# Patient Record
Sex: Female | Born: 1965
Health system: Southern US, Community
[De-identification: ages and names within clinical notes are randomized; demographics above are authoritative.]

## PROBLEM LIST (undated history)

## (undated) DIAGNOSIS — T7840XA Allergy, unspecified, initial encounter: Secondary | ICD-10-CM

## (undated) DIAGNOSIS — E079 Disorder of thyroid, unspecified: Secondary | ICD-10-CM

## (undated) HISTORY — PX: ABDOMINAL HYSTERECTOMY: SHX81

## (undated) HISTORY — DX: Disorder of thyroid, unspecified: E07.9

## (undated) HISTORY — PX: GASTRIC BYPASS: SHX52

## (undated) HISTORY — PX: CHOLECYSTECTOMY: SHX55

## (undated) HISTORY — DX: Allergy, unspecified, initial encounter: T78.40XA

---

## 2014-01-21 ENCOUNTER — Ambulatory Visit (INDEPENDENT_AMBULATORY_CARE_PROVIDER_SITE_OTHER): Payer: Managed Care, Other (non HMO)

## 2014-01-21 VITALS — BP 113/76 | HR 66 | Resp 18

## 2014-01-21 DIAGNOSIS — M79609 Pain in unspecified limb: Secondary | ICD-10-CM

## 2014-01-21 DIAGNOSIS — Q828 Other specified congenital malformations of skin: Secondary | ICD-10-CM

## 2014-01-21 DIAGNOSIS — B070 Plantar wart: Secondary | ICD-10-CM

## 2014-01-21 NOTE — Progress Notes (Signed)
   Subjective:    Patient ID: Stacey Mcdaniel, female    DOB: 1966-02-18, 48 y.o.   MRN: [id number]  HPI I have these places on the bottom of both my feet and they hurt and have been there for about 2 months and I have tried to take care of it myself    Review of Systems  Constitutional: Positive for unexpected weight change.  Musculoskeletal: Positive for back pain.       Joint pain  All other systems reviewed and are negative.      Objective:   Physical Exam Neurovascular status is intact with pedal pulses palpable DP and PT +2/4 bilateral capillary refill time 3 seconds all digits epicritic and proprioceptive sensations intact and symmetric bilateral normal plantar response DTRs not elicited dermatologically skin color pigment normal hair growth absent NP keratotic lesions sub-fifth metatarsal bilateral sub-fifth met base left multiple lesions of the site are noted patient had history of these with previous treatment has been salicylic acid crumbly for couple days and time with temporary improvement. 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 Patient is also instructed topical salicylic acid application and occlusion with duct tape as instructed applied to acid and duct tape daily for the next 7 days up to 14 days if needed reappointed future and as-needed basis if fails to resolve or recurs in the future as needed. Contact us in changes or exacerbations occur next  Harriet Masson DPM

## 2014-01-21 NOTE — Patient Instructions (Signed)

## 2015-02-10 ENCOUNTER — Emergency Department (HOSPITAL_BASED_OUTPATIENT_CLINIC_OR_DEPARTMENT_OTHER): Payer: Managed Care, Other (non HMO)

## 2015-02-10 ENCOUNTER — Emergency Department (HOSPITAL_BASED_OUTPATIENT_CLINIC_OR_DEPARTMENT_OTHER)
Admission: EM | Admit: 2015-02-10 | Discharge: 2015-02-10 | Disposition: A | Discharge status: Home / Self Care | Payer: Managed Care, Other (non HMO) | Attending: Emergency Medicine | Admitting: Emergency Medicine

## 2015-02-10 ENCOUNTER — Encounter (HOSPITAL_BASED_OUTPATIENT_CLINIC_OR_DEPARTMENT_OTHER): Payer: Self-pay

## 2015-02-10 DIAGNOSIS — W1849XA Other slipping, tripping and stumbling without falling, initial encounter: Secondary | ICD-10-CM | POA: Diagnosis not present

## 2015-02-10 DIAGNOSIS — Y9389 Activity, other specified: Secondary | ICD-10-CM | POA: Diagnosis not present

## 2015-02-10 DIAGNOSIS — Y9289 Other specified places as the place of occurrence of the external cause: Secondary | ICD-10-CM | POA: Insufficient documentation

## 2015-02-10 DIAGNOSIS — Y998 Other external cause status: Secondary | ICD-10-CM | POA: Insufficient documentation

## 2015-02-10 DIAGNOSIS — Z79899 Other long term (current) drug therapy: Secondary | ICD-10-CM | POA: Diagnosis not present

## 2015-02-10 DIAGNOSIS — S52502A Unspecified fracture of the lower end of left radius, initial encounter for closed fracture: Secondary | ICD-10-CM | POA: Insufficient documentation

## 2015-02-10 DIAGNOSIS — S6992XA Unspecified injury of left wrist, hand and finger(s), initial encounter: Secondary | ICD-10-CM | POA: Diagnosis present

## 2015-02-10 DIAGNOSIS — E079 Disorder of thyroid, unspecified: Secondary | ICD-10-CM | POA: Insufficient documentation

## 2015-02-10 DIAGNOSIS — S52602A Unspecified fracture of lower end of left ulna, initial encounter for closed fracture: Secondary | ICD-10-CM | POA: Insufficient documentation

## 2015-02-10 DIAGNOSIS — S6991XA Unspecified injury of right wrist, hand and finger(s), initial encounter: Secondary | ICD-10-CM | POA: Diagnosis not present

## 2015-02-10 MED ORDER — OXYCODONE-ACETAMINOPHEN 5-325 MG PO TABS: 1.0000 | ORAL_TABLET | Freq: Four times a day (QID) | ORAL | Status: DC | PRN

## 2015-02-10 MED ORDER — OXYCODONE-ACETAMINOPHEN 5-325 MG PO TABS
2.0000 | ORAL_TABLET | Freq: Once | ORAL | Status: AC
  Administered 2015-02-10: 2 via ORAL
  Filled 2015-02-10: qty 2

## 2015-02-10 NOTE — ED Notes (Signed)
Pt given Rx x 1 for percocet- Pt has a ride at bedside

## 2015-02-10 NOTE — ED Provider Notes (Signed)
CSN: [id number]     Arrival date & time 02/10/15  1745 History   First MD Initiated Contact with Patient 02/10/15 1807     Chief Complaint  Patient presents with  . Wrist Injury     (Consider location/radiation/quality/duration/timing/severity/associated sxs/prior Treatment) HPI Comments: 49 year old female complaining of bilateral wrist pain occurring around 4:45 PM today. States she was on her lunch break when she slipped, putting her arms behind her and landing onto both of her wrists, left worse than right. States her left wrist pain is "very bad", worse with movements and is starting to swell. No alleviating factors. No medications taken prior to arrival. Her right wrist is only mildly sore, however would like an x-ray of it to ensure there is no break. Denies numbness or tingling.  Patient is a 49 y.o. female presenting with wrist injury. The history is provided by the patient.  Wrist Injury Associated symptoms: no fever     Past Medical History  Diagnosis Date  . Thyroid disease   . Allergy    Past Surgical History  Procedure Laterality Date  . Gastric bypass    . Cholecystectomy    . Abdominal hysterectomy     No family history on file. History  Substance Use Topics  . Smoking status: Never Smoker   . Smokeless tobacco: Never Used  . Alcohol Use: No   OB History    No data available     Review of Systems  Constitutional: Negative for fever.  Respiratory: Negative.   Cardiovascular: Negative.   Musculoskeletal:       + BL wrist pain. L wrist swelling.  Skin: Negative for wound.      Allergies  Review of patient's allergies indicates no known allergies.  Home Medications   Prior to Admission medications   Medication Sig Start Date End Date Taking? Authorizing Provider  levothyroxine (SYNTHROID, LEVOTHROID) 125 MCG tablet Take 125 mcg by mouth daily before breakfast.    Historical Provider, MD  oxyCODONE-acetaminophen (PERCOCET) 5-325 MG per tablet Take  1-2 tablets by mouth every 6 (six) hours as needed for severe pain. 02/10/15   Zillah Alexie M Darrion Macaulay, PA-C   BP 143/87 mmHg  Pulse 78  Temp(Src) 97.8 F (36.6 C) (Oral)  Resp 18  Ht  (1.651 m)  Wt 165 lb (74.844 kg)  BMI 27.46 kg/m2  SpO2 100% Physical Exam  Constitutional: She is oriented to person, place, and time. She appears well-developed and well-nourished. No distress.  HENT:  Head: Normocephalic and atraumatic.  Mouth/Throat: Oropharynx is clear and moist.  Eyes: Conjunctivae and EOM are normal.  Neck: Normal range of motion. Neck supple.  Cardiovascular: Normal rate, regular rhythm and normal heart sounds.   Pulmonary/Chest: Effort normal and breath sounds normal. No respiratory distress.  Musculoskeletal:  R wrist TTP over distal radius. No swelling or deformity. FROM. +2 radial pulse. No snuffbox tenderness. L wrist TTP over distal radius and carpal bones. No snuffbox tenderness. Mild swelling over distal radius. No deformity. ROM limited by pain. +2 radial pulse.  Neurological: She is alert and oriented to person, place, and time. No sensory deficit.  Skin: Skin is warm and dry.  Psychiatric: She has a normal mood and affect. Her behavior is normal.  Nursing note and vitals reviewed.   ED Course  Procedures (including critical care time) Labs Review Labs Reviewed - No data to display  Imaging Review Dg Wrist Complete Left  02/10/2015   CLINICAL DATA:  Bilateral wrist pain  after falling. Initial encounter.  EXAM: LEFT WRIST - COMPLETE 3+ VIEW  COMPARISON:  None.  FINDINGS: The bones are demineralized. There is an acute mildly displaced fracture of the distal radial metaphysis. This fracture demonstrates no definite intra-articular extension. There is mild disruption of the dorsal cortex on the lateral view. There is a mildly displaced ulnar styloid fracture. No evidence of carpal bone fracture or dislocation. Mild soft tissue swelling noted at the wrist.  IMPRESSION: Mildly  displaced fractures of the distal left radius and ulna as described.   Electronically Signed   By: Carey BullocksWilliam  Veazey M.D.   On: 02/10/2015 18:49   Dg Wrist Complete Right  02/10/2015   CLINICAL DATA:  Bilateral wrist pain after falling. Initial encounter.  EXAM: RIGHT WRIST - COMPLETE 3+ VIEW  COMPARISON:  None.  FINDINGS: The bones are mildly demineralized. There is no evidence of acute fracture or dislocation. The joint spaces are maintained. No focal soft tissue swelling identified.  IMPRESSION: No acute osseous findings.   Electronically Signed   By: Carey BullocksWilliam  Veazey M.D.   On: 02/10/2015 18:48     EKG Interpretation None      MDM   Final diagnoses:  Distal radius fracture, left, closed, initial encounter  Distal end of ulna fracture, closed, left, initial encounter   Neurovascularly intact. X-ray showing a mildly displaced fracture of the distal left radius and ulna. I also personally reviewed xrays. Sugar tong splint applied. [place] with. Percocet for pain. Stable for d/c. Return precautions given. Patient states understanding of treatment care plan and is agreeable.  Kathrynn SpeedRobyn M Jaiyah Beining, PA-C 02/10/15 1904  Zadie Rhineonald Wickline, MD 02/11/15 2240

## 2015-02-10 NOTE — ED Notes (Signed)
John, EMT at bedside to splint

## 2015-02-10 NOTE — ED Notes (Addendum)
Fell-pain to left wrist-later stated pain to both wrists and requested xray of both

## 2015-02-10 NOTE — Discharge Instructions (Signed)
Take percocet for severe pain only. No driving or operating heavy machinery while taking percocet. This medication may cause drowsiness. Follow-up with your orthopedist within the next 2-3 days. Keep your arm elevated and apply ice.  Radial Fracture You have a broken bone (fracture) of the forearm. This is the part of your arm between the elbow and your wrist. Your forearm is made up of two bones. These are the radius and ulna. Your fracture is in the radial shaft. This is the bone in your forearm located on the thumb side. A cast or splint is used to protect and keep your injured bone from moving. The cast or splint will be on generally for about 5 to 6 weeks, with individual variations. HOME CARE INSTRUCTIONS   Keep the injured part elevated while sitting or lying down. Keep the injury above the level of your heart (the center of the chest). This will decrease swelling and pain.  Apply ice to the injury for 15-20 minutes, 03-04 times per day while awake, for 2 days. Put the ice in a plastic bag and place a towel between the bag of ice and your cast or splint.  Move your fingers to avoid stiffness and minimize swelling.  If you have a plaster or fiberglass cast:  Do not try to scratch the skin under the cast using sharp or pointed objects.  Check the skin around the cast every day. You may put lotion on any red or sore areas.  Keep your cast dry and clean.  If you have a plaster splint:  Wear the splint as directed.  You may loosen the elastic around the splint if your fingers become numb, tingle, or turn cold or blue.  Do not put pressure on any part of your cast or splint. It may break. Rest your cast only on a pillow for the first 24 hours until it is fully hardened.  Your cast or splint can be protected during bathing with a plastic bag. Do not lower the cast or splint into water.  Only take over-the-counter or prescription medicines for pain, discomfort, or fever as directed by  your caregiver. SEEK IMMEDIATE MEDICAL CARE IF:   Your cast gets damaged or breaks.  You have more severe pain or swelling than you did before getting the cast.  You have severe pain when stretching your fingers.  There is a bad smell, new stains and/or pus-like (purulent) drainage coming from under the cast.  Your fingers or hand turn pale or blue and become cold or your loose feeling. Document Released: 02/15/2006 Document Revised: 11/27/2011 Document Reviewed: 05/14/2006 [facility] Patient Information 2015 [place], [place]. This information is not intended to replace advice given to you by your health care provider. Make sure you discuss any questions you have with your health care provider.  Ulnar Fracture You have a fracture (broken bone) of the forearm. This is the part of your arm between the elbow and your wrist. Your forearm is made up of two bones. These are the radius and ulna. Your fracture is in the ulna. This is the bone in your forearm located on the little finger side of your forearm. A cast or splint is used to protect and keep your injured bone from moving. The cast or splint will be on generally for about 5 to 6 weeks, with individual variations. HOME CARE INSTRUCTIONS   Keep the injured part elevated while sitting or lying down. Keep the injury above the level of your heart (the  center of the chest). This will decrease swelling and pain.  Apply ice to the injury for 15-20 minutes, 03-04 times per day while awake, for 2 days. Put the ice in a plastic bag and place a towel between the bag of ice and your cast or splint.  Move your fingers to avoid stiffness and minimize swelling.  If you have a plaster or fiberglass cast:  Do not try to scratch the skin under the cast using sharp or pointed objects.  Check the skin around the cast every day. You may put lotion on any red or sore areas.  Keep your cast dry and clean.  If you have a plaster splint:  Wear the splint as  directed.  You may loosen the elastic around the splint if your fingers become numb, tingle, or turn cold or blue.  Do not put pressure on any part of your cast or splint. It may break. Rest your cast only on a pillow the first 24 hours until it is fully hardened.  Your cast or splint can be protected during bathing with a plastic bag. Do not lower the cast or splint into water.  Only take over-the-counter or prescription medicines for pain, discomfort, or fever as directed by your caregiver. SEEK IMMEDIATE MEDICAL CARE IF:   Your cast gets damaged or breaks.  You have more severe pain or swelling than you did before the cast.  You have severe pain when stretching your fingers.  There is a bad smell or new stains and/or purulent (pus like) drainage coming from under the cast. Document Released: 02/15/2006 Document Revised: 11/27/2011 Document Reviewed: 07/20/2007 [employer] Patient Information 2015 [place]. This information is not intended to replace advice given to you by your health care provider. Make sure you discuss any questions you have with your health care provider.

## 2015-08-05 ENCOUNTER — Other Ambulatory Visit: Payer: Self-pay | Admitting: Orthopedic Surgery

## 2015-08-05 DIAGNOSIS — M7918 Myalgia, other site: Secondary | ICD-10-CM

## 2015-08-25 ENCOUNTER — Other Ambulatory Visit: Payer: Managed Care, Other (non HMO)

## 2015-08-31 ENCOUNTER — Ambulatory Visit
Admission: RE | Admit: 2015-08-31 | Discharge: 2015-08-31 | Disposition: A | Discharge status: Home / Self Care | Payer: Managed Care, Other (non HMO) | Source: Ambulatory Visit | Attending: Orthopedic Surgery | Admitting: Orthopedic Surgery

## 2015-08-31 DIAGNOSIS — M7918 Myalgia, other site: Secondary | ICD-10-CM

## 2015-08-31 MED ORDER — GADOBENATE DIMEGLUMINE 529 MG/ML IV SOLN
15.0000 mL | Freq: Once | INTRAVENOUS | Status: AC | PRN
  Administered 2015-08-31: 15 mL via INTRAVENOUS

## 2016-03-22 ENCOUNTER — Ambulatory Visit: Payer: Self-pay

## 2016-03-22 ENCOUNTER — Ambulatory Visit (INDEPENDENT_AMBULATORY_CARE_PROVIDER_SITE_OTHER): Payer: Managed Care, Other (non HMO) | Admitting: Podiatry

## 2016-03-22 ENCOUNTER — Encounter: Payer: Self-pay | Admitting: Podiatry

## 2016-03-22 VITALS — BP 131/84 | HR 80 | Resp 16

## 2016-03-22 DIAGNOSIS — M779 Enthesopathy, unspecified: Secondary | ICD-10-CM

## 2016-03-22 DIAGNOSIS — B078 Other viral warts: Secondary | ICD-10-CM

## 2016-03-22 DIAGNOSIS — B070 Plantar wart: Secondary | ICD-10-CM | POA: Diagnosis not present

## 2016-03-22 DIAGNOSIS — B079 Viral wart, unspecified: Secondary | ICD-10-CM

## 2016-03-22 MED ORDER — TRIAMCINOLONE ACETONIDE 10 MG/ML IJ SUSP
10.0000 mg | Freq: Once | INTRAMUSCULAR | Status: AC
  Administered 2016-03-22: 10 mg

## 2016-03-22 NOTE — Progress Notes (Signed)
Subjective:     Patient ID: Stacey Mcdaniel, female   DOB: 1966/01/23, 50 y.o.   MRN: [id number]  HPI patient presents with lesions on the plantar aspect of both feet right over left with keratotic-like tissue formation and fluid buildup around the fifth MPJ right. It is painful when pressed and makes shoe gear difficult and there is fluid and inflammatory changes fifth MPJ right.   Review of Systems     Objective:   Physical Exam Neurovascular status intact muscle strength adequate range of motion within normal limits with inflammation around fifth MPJ right and keratotic lesion with lesions also noted on the plantar aspect of both feet that upon debridement show pinpoint bleeding    Assessment:     Inflammatory capsulitis fifth MPJ right along with probability for verruca plantaris    Plan:     Debrided all lesions and applied chemical agent to create an immune response right and debrided remaining lesions and instructed on what to do if any immune system reaction should occur

## 2016-07-02 ENCOUNTER — Encounter (HOSPITAL_COMMUNITY): Payer: Self-pay

## 2016-07-02 ENCOUNTER — Emergency Department (HOSPITAL_COMMUNITY): Payer: Worker's Compensation

## 2016-07-02 ENCOUNTER — Emergency Department (HOSPITAL_COMMUNITY)
Admission: EM | Admit: 2016-07-02 | Discharge: 2016-07-02 | Disposition: A | Discharge status: Home / Self Care | Payer: Worker's Compensation | Attending: Emergency Medicine | Admitting: Emergency Medicine

## 2016-07-02 DIAGNOSIS — Y9289 Other specified places as the place of occurrence of the external cause: Secondary | ICD-10-CM | POA: Diagnosis not present

## 2016-07-02 DIAGNOSIS — Y990 Civilian activity done for income or pay: Secondary | ICD-10-CM | POA: Insufficient documentation

## 2016-07-02 DIAGNOSIS — M25562 Pain in left knee: Secondary | ICD-10-CM | POA: Insufficient documentation

## 2016-07-02 DIAGNOSIS — W010XXA Fall on same level from slipping, tripping and stumbling without subsequent striking against object, initial encounter: Secondary | ICD-10-CM | POA: Insufficient documentation

## 2016-07-02 DIAGNOSIS — Y939 Activity, unspecified: Secondary | ICD-10-CM | POA: Insufficient documentation

## 2016-07-02 MED ORDER — NAPROXEN 250 MG PO TABS: 250.0000 mg | ORAL_TABLET | Freq: Two times a day (BID) | ORAL | 0 refills | Status: DC

## 2016-07-02 NOTE — ED Triage Notes (Signed)
Pt slipped and fell and hurt her left knee at work on 9/30. Pt reports pain is still present and also has swelling.

## 2016-07-02 NOTE — ED Notes (Signed)
Declined W/C at D/C and was escorted to lobby by RN. 

## 2016-07-02 NOTE — ED Provider Notes (Signed)
[facility]-EMERGENCY DEPT Provider Note   CSN: [id number] Arrival date & time: 07/02/16  1253     History   Chief Complaint Chief Complaint  Patient presents with  . Fall   The history is provided by the patient and medical records. No language interpreter was used.    HPI Comments:  Stacey Mcdaniel is a 50 y.o. female who presents to the Emergency Department complaining of slipping and falling about two weeks ago while she was at work. She states she twisted the left knee when she fell and has been having pain since. Pt states she has arthritis in the left knee and has injured the medial left knee in the past. She has been experiencing a burning pain and swelling since the fall. She is ambulatory without assistance. Standing or walking increases the symptoms. Resting helps to alleviate the pain. She is here for a work note because she would like to return to work. She denies numbness, tingling or weakness of the LLE, bruising, wounds, calf swelling, head injury or LOC at the time of the fall.   Past Medical History:  Diagnosis Date  . Allergy   . Thyroid disease     There are no active problems to display for this patient.   Past Surgical History:  Procedure Laterality Date  . ABDOMINAL HYSTERECTOMY    . CHOLECYSTECTOMY    . GASTRIC BYPASS      OB History    No data available       Home Medications    Prior to Admission medications   Medication Sig Start Date End Date Taking? Authorizing Provider  levothyroxine (SYNTHROID, LEVOTHROID) 125 MCG tablet Take 125 mcg by mouth daily before breakfast.    Historical Provider, MD  naproxen (NAPROSYN) 250 MG tablet Take 1 tablet (250 mg total) by mouth 2 (two) times daily with a meal. 07/02/16   Everlene FarrierWilliam Felisha Claytor, PA-C    Family History History reviewed. No pertinent family history.  Social History Social History  Substance Use Topics  . Smoking status: Never Smoker  . Smokeless tobacco: Never Used  . Alcohol use No      Allergies   Review of patient's allergies indicates no known allergies.   Review of Systems Review of Systems  Constitutional: Negative for fever.  Cardiovascular: Negative for leg swelling.  Musculoskeletal: Positive for arthralgias, joint swelling and myalgias.  Skin: Negative for color change and wound.  Neurological: Negative for syncope, weakness and numbness.     Physical Exam Updated Vital Signs BP 136/94 (BP Location: Right Arm)   Pulse 79   Temp 98 F (36.7 C) (Oral)   Resp 16   Ht 5\' 5"  (1.651 m)   Wt 170 lb (77.1 kg)   SpO2 96%   BMI 28.29 kg/m   Physical Exam  Constitutional: She appears well-developed and well-nourished. No distress.  HENT:  Head: Normocephalic and atraumatic.  Eyes: Right eye exhibits no discharge. Left eye exhibits no discharge.  Cardiovascular: Normal rate, regular rhythm and intact distal pulses.   Bilateral DP and PT pulses intact.  Pulmonary/Chest: Effort normal. No respiratory distress.  Musculoskeletal: Normal range of motion. She exhibits tenderness. She exhibits no edema or deformity.  Mild TTP to left medial knee. No left knee edema, erythema, ecchymosis or warmth. No left knee laxity. No calf edema or tenderness.   Neurological: She is alert. Coordination normal.  Sensation intact to BLE. Normal gait.  Skin: Skin is warm and dry. Capillary refill takes less  than 2 seconds. No rash noted. She is not diaphoretic. No erythema. No pallor.  Psychiatric: She has a normal mood and affect. Her behavior is normal.  Nursing note and vitals reviewed.    ED Treatments / Results  DIAGNOSTIC STUDIES: Oxygen Saturation is 96% on RA, adequate by my interpretation.   COORDINATION OF CARE: 4:40 PM- Will prescribe Naproxen for pain and advised pt to take OTC Tylenol if needed as well. Will give left knee sleeve and note to return to work immediately per pt request. Pt verbalizes understanding and agrees to plan.  Medications - No data  to display   Labs (all labs ordered are listed, but only abnormal results are displayed) Labs Reviewed - No data to display  EKG  EKG Interpretation None       Radiology Dg Knee Complete 4 Views Left  Result Date: 07/02/2016 CLINICAL DATA:  Slipped 2 weeks ago. Medial patellar knee pain since. Fall. EXAM: LEFT KNEE - COMPLETE 4+ VIEW COMPARISON:  None. FINDINGS: Mild degenerative changes are most evident within the lateral and patellofemoral compartments of the knee. There is no effusion. No acute bone or soft tissue abnormality is present. IMPRESSION: 1. Mild degenerative changes within the lateral and patellofemoral compartments of the knee without acute abnormality. Electronically Signed   By: Marin Roberts M.D.   On: 07/02/2016 14:28    Procedures Procedures (including critical care time)  Medications Ordered in ED Medications - No data to display   Initial Impression / Assessment and Plan / ED Course  I have reviewed the triage vital signs and the nursing notes.  Pertinent labs & imaging results that were available during my care of the patient were reviewed by me and considered in my medical decision making (see chart for details).  Clinical Course   This is a 50 y.o. female who presents to the Emergency Department complaining of slipping and falling about two weeks ago while she was at work. She states she twisted the left knee when she fell and has been having pain since. Pt states she has arthritis in the left knee and has injured the medial left knee in the past. She has been experiencing a burning pain and swelling since the fall. She is ambulatory without assistance. Standing or walking increases the symptoms. Resting helps to alleviate the pain. She is here for a work note because she would like to return to work. On examination is afebrile nontoxic appearing. There is mild tenderness to the medial aspect of her left knee. No knee ecchymosis, erythema, edema or  warmth. No left knee laxity noted. X-ray shows some degenerative changes but no acute abnormality. We'll place in a knee sleeve and have her follow-up with sports medicine clinic. I discussed return precautions. I advised the patient to follow-up with their primary care provider this week. I advised the patient to return to the emergency department with new or worsening symptoms or new concerns. The patient verbalized understanding and agreement with plan.    Final Clinical Impressions(s) / ED Diagnoses   Final diagnoses:  Acute pain of left knee    New Prescriptions Discharge Medication List as of 07/02/2016  4:38 PM    START taking these medications   Details  naproxen (NAPROSYN) 250 MG tablet Take 1 tablet (250 mg total) by mouth 2 (two) times daily with a meal., [place] 07/02/2016, Print       I personally performed the services described in this documentation, which was scribed in  my presence. The recorded information has been reviewed and is accurate.        Everlene Farrier, PA-C 07/02/16 1738    Charlynne Pander, MD 07/03/16 [place]

## 2016-12-08 ENCOUNTER — Encounter: Payer: Self-pay | Admitting: Sports Medicine

## 2016-12-08 ENCOUNTER — Ambulatory Visit (INDEPENDENT_AMBULATORY_CARE_PROVIDER_SITE_OTHER): Payer: Managed Care, Other (non HMO) | Admitting: Sports Medicine

## 2016-12-08 DIAGNOSIS — Q828 Other specified congenital malformations of skin: Secondary | ICD-10-CM

## 2016-12-08 DIAGNOSIS — M79672 Pain in left foot: Secondary | ICD-10-CM

## 2016-12-08 DIAGNOSIS — M79671 Pain in right foot: Secondary | ICD-10-CM

## 2016-12-08 NOTE — Progress Notes (Signed)
  Subjective: Stacey Mcdaniel is a 51 y.o. female patient who presents to office for evaluation of Right> Left foot pain secondary to callus skin. Patient complains of pain at the lesion present Right>Left foot at the plantar aspect on both feet under 5th metatarsals. Patient has tried injection and wart treatment last visit with Dr. Paulla Dolly last year with no relief in symptoms. Patient denies any other pedal complaints.   There are no active problems to display for this patient.   Current Outpatient Prescriptions on File Prior to Visit  Medication Sig Dispense Refill  . naproxen (NAPROSYN) 250 MG tablet Take 1 tablet (250 mg total) by mouth 2 (two) times daily with a meal. 30 tablet 0   No current facility-administered medications on file prior to visit.     No Known Allergies  Objective:  General: Alert and oriented x3 in no acute distress  Dermatology: Keratotic lesion present sub met 5 bilateral with skin lines transversing the lesion, pain is present with direct pressure to the lesion with a central nucleated core noted, no webspace macerations, no ecchymosis bilateral, all nails x 10 are well manicured.  Vascular: Dorsalis Pedis and Posterior Tibial pedal pulses 2/4, Capillary Fill Time 3 seconds, + pedal hair growth bilateral, no edema bilateral lower extremities, Temperature gradient within normal limits.  Neurology: Johney Maine sensation intact via light touch bilateral.  Musculoskeletal: Mild tenderness with palpation at the keratotic lesion site on Right>Left, Muscular strength 5/5 in all groups without pain or limitation on range of motion. No lower extremity muscular or boney deformity noted.  Assessment and Plan: Problem List Items Addressed This Visit    None    Visit Diagnoses    Porokeratosis    -  Primary   Foot pain, bilateral         -Complete examination performed -Discussed treatment options -Parred keratoic lesions using a chisel blade; treated the area  withSalinocaine covered with moleskin -Applied metatarsal pad strapping bilateral and advised patient if this works well will benefit long term from orthotics -Encouraged daily skin emollients  -Encouraged use of pumice stone -Advised good supportive shoes and inserts -Patient to return to office as needed or sooner if condition worsens.  Landis Martins, DPM

## 2016-12-29 ENCOUNTER — Ambulatory Visit (INDEPENDENT_AMBULATORY_CARE_PROVIDER_SITE_OTHER): Payer: Managed Care, Other (non HMO) | Admitting: Sports Medicine

## 2016-12-29 ENCOUNTER — Encounter: Payer: Self-pay | Admitting: Sports Medicine

## 2016-12-29 DIAGNOSIS — M79672 Pain in left foot: Secondary | ICD-10-CM

## 2016-12-29 DIAGNOSIS — Q828 Other specified congenital malformations of skin: Secondary | ICD-10-CM | POA: Diagnosis not present

## 2016-12-29 DIAGNOSIS — M79671 Pain in right foot: Secondary | ICD-10-CM

## 2016-12-29 DIAGNOSIS — M722 Plantar fascial fibromatosis: Secondary | ICD-10-CM

## 2016-12-29 NOTE — Progress Notes (Signed)
  Subjective: Stacey Mcdaniel is a 51 y.o. female patient who returns to office for evaluation of Right> Left foot pain secondary to callus skin. Patient states that the met pads helped greatly and she desires for orthotics to be made. Was able to wear tennis shoes without pain at ball and arch. Patient denies any other pedal complaints.   There are no active problems to display for this patient.   Current Outpatient Prescriptions on File Prior to Visit  Medication Sig Dispense Refill  . levothyroxine (SYNTHROID, LEVOTHROID) 125 MCG tablet Take by mouth.    . naproxen (NAPROSYN) 250 MG tablet Take 1 tablet (250 mg total) by mouth 2 (two) times daily with a meal. 30 tablet 0   No current facility-administered medications on file prior to visit.     No Known Allergies  Objective:  General: Alert and oriented x3 in no acute distress  Dermatology: Minimal Keratotic lesion present sub met 5 bilateral with skin lines transversing the lesion, mild pain is present with direct pressure to the lesion with a central nucleated core noted, no webspace macerations, no ecchymosis bilateral, all nails x 10 are well manicured.  Vascular: Dorsalis Pedis and Posterior Tibial pedal pulses 2/4, Capillary Fill Time 3 seconds, + pedal hair growth bilateral, no edema bilateral lower extremities, Temperature gradient within normal limits.  Neurology: Johney Maine sensation intact via light touch bilateral.  Musculoskeletal: Mild tenderness with palpation at the keratotic lesion site on Right>Left, No acute tenderness along plantar fascia. Muscular strength 5/5 in all groups without pain or limitation on range of motion. No lower extremity muscular or boney deformity noted.  Assessment and Plan: Problem List Items Addressed This Visit    None    Visit Diagnoses    Porokeratosis    -  Primary   Bilateral plantar fasciitis       Foot pain, bilateral         -Complete examination performed -Discussed treatment  options -Scanned for orthotics and Rx sent to [facility] for full length orthotic with met pad -Encouraged daily skin emollients  -Encouraged use of pumice stone -Advised good supportive shoes and continue use of met pad strapping until custom orthotics are received  -Patient to return to office to [facility] or sooner if condition worsens.  Landis Martins, DPM

## 2017-01-24 ENCOUNTER — Ambulatory Visit (INDEPENDENT_AMBULATORY_CARE_PROVIDER_SITE_OTHER): Payer: Managed Care, Other (non HMO) | Admitting: Sports Medicine

## 2017-01-24 DIAGNOSIS — M79671 Pain in right foot: Secondary | ICD-10-CM

## 2017-01-24 DIAGNOSIS — M779 Enthesopathy, unspecified: Secondary | ICD-10-CM

## 2017-01-24 DIAGNOSIS — Q828 Other specified congenital malformations of skin: Secondary | ICD-10-CM

## 2017-01-24 DIAGNOSIS — M722 Plantar fascial fibromatosis: Secondary | ICD-10-CM

## 2017-01-24 DIAGNOSIS — M79672 Pain in left foot: Secondary | ICD-10-CM

## 2017-01-24 NOTE — Patient Instructions (Signed)

## 2017-01-24 NOTE — Progress Notes (Signed)
Patient discussed with Betha. One pair of Custom Functional orthotics were fitted and dispensed to patient with wear instructions and break in period explained. Patient to follow up as scheduled for continued care or sooner if problems or issues arise. -Dr. Masayuki Sakai  

## 2017-03-07 ENCOUNTER — Ambulatory Visit: Payer: Managed Care, Other (non HMO) | Admitting: Sports Medicine

## 2017-07-20 ENCOUNTER — Ambulatory Visit (INDEPENDENT_AMBULATORY_CARE_PROVIDER_SITE_OTHER): Payer: Managed Care, Other (non HMO) | Admitting: Sports Medicine

## 2017-07-20 ENCOUNTER — Encounter: Payer: Self-pay | Admitting: Sports Medicine

## 2017-07-20 ENCOUNTER — Ambulatory Visit (INDEPENDENT_AMBULATORY_CARE_PROVIDER_SITE_OTHER): Payer: Managed Care, Other (non HMO)

## 2017-07-20 DIAGNOSIS — M722 Plantar fascial fibromatosis: Secondary | ICD-10-CM

## 2017-07-20 DIAGNOSIS — M79672 Pain in left foot: Secondary | ICD-10-CM

## 2017-07-20 MED ORDER — TRIAMCINOLONE ACETONIDE 10 MG/ML IJ SUSP: 10.0000 mg | Freq: Once | INTRAMUSCULAR | Status: AC

## 2017-07-20 NOTE — Patient Instructions (Signed)

## 2017-07-20 NOTE — Progress Notes (Signed)
Subjective: Stacey Mcdaniel is a 51 y.o. female patient presents to office with complaint of heel pain on the left. Patient admits to post static dyskinesia for 4 weeks in duration. Patient has treated this problem with the wrist and using her orthotics with no relief.  Reports that she is also having a recurrence of pain to the ball of the foot feels like she needs more padding and that the additional padding that we put did not make a difference with adding the metatarsal pad states she felt the best with the met pad taping that we had initially did for her foot pain at the ball of both feet.  Patient denies any other pedal complaints.   There are no active problems to display for this patient.   Current Outpatient Prescriptions on File Prior to Visit  Medication Sig Dispense Refill  . levothyroxine (SYNTHROID, LEVOTHROID) 125 MCG tablet Take by mouth.    . naproxen (NAPROSYN) 250 MG tablet Take 1 tablet (250 mg total) by mouth 2 (two) times daily with a meal. 30 tablet 0   No current facility-administered medications on file prior to visit.     No Known Allergies  Objective: Physical Exam General: The patient is alert and oriented x3 in no acute distress.  Dermatology: Skin is warm, dry and supple bilateral lower extremities. Nails 1-10 are normal. There is no erythema, edema, no eccymosis, no open lesions present.  Minimal keratosis 5 bilateral, integument is otherwise unremarkable.  Vascular: Dorsalis Pedis pulse and Posterior Tibial pulse are 2/4 bilateral. Capillary fill time is immediate to all digits.  Neurological: Grossly intact to light touch with an achilles reflex of +2/5 and a  negative Tinel's sign bilateral.  Musculoskeletal: Tenderness to palpation at the medial calcaneal tubercale and through the insertion of the plantar fascia on the left foot. No reproducible tenderness to balls on today's visit.  No pain with compression of calcaneus bilateral. No pain with tuning  fork to calcaneus bilateral. No pain with calf compression bilateral. There is decreased Ankle joint range of motion bilateral. All other joints range of motion within normal limits bilateral. Strength 5/5 in all groups bilateral.   Xray, Left foot:  Normal osseous mineralization. Joint spaces preserved. No fracture/dislocation/boney destruction.  Very minimal calcaneal spur present with mild thickening of plantar fascia. No other soft tissue abnormalities or radiopaque foreign bodies.   Assessment and Plan: Problem List Items Addressed This Visit    None    Visit Diagnoses    Plantar fasciitis, left    -  Primary   Relevant Medications   triamcinolone acetonide (KENALOG) 10 MG/ML injection 10 mg   Other Relevant Orders   DG Foot Complete Left   Pain of left heel       Relevant Medications   triamcinolone acetonide (KENALOG) 10 MG/ML injection 10 mg     -Complete examination performed.  -Xrays reviewed -Discussed with patient in detail the condition of plantar fasciitis, how this occurs and general treatment options. Explained both conservative and surgical treatments.  -After oral consent and aseptic prep, injected a mixture containing 1 ml of 2%  plain lidocaine, 1 ml 0.5% plain marcaine, 0.5 ml of kenalog 10 and 0.5 ml of dexamethasone phosphate into left heel. Post-injection care discussed with patient.  -Patient does not want to try oral medication -Recommended good supportive shoes and advised use of OTC insert with the metatarsal pad I added to the forefoot. Explained to patient that if these orthoses work  well, we will send her orthotics back to add more padding throughout the entire forefoot -Explained and dispensed to patient daily stretching exercises. -Recommend patient to ice affected area 1-2x daily. -Patient to return to office in 3-4 weeks for follow up or sooner if problems or questions arise.  Landis Martins, DPM

## 2017-07-24 ENCOUNTER — Other Ambulatory Visit: Payer: Self-pay | Admitting: Sports Medicine

## 2017-07-24 DIAGNOSIS — M722 Plantar fascial fibromatosis: Secondary | ICD-10-CM

## 2017-08-15 ENCOUNTER — Ambulatory Visit (INDEPENDENT_AMBULATORY_CARE_PROVIDER_SITE_OTHER): Payer: Managed Care, Other (non HMO) | Admitting: Sports Medicine

## 2017-08-15 ENCOUNTER — Encounter: Payer: Self-pay | Admitting: Sports Medicine

## 2017-08-15 DIAGNOSIS — Q828 Other specified congenital malformations of skin: Secondary | ICD-10-CM

## 2017-08-15 DIAGNOSIS — M722 Plantar fascial fibromatosis: Secondary | ICD-10-CM

## 2017-08-15 NOTE — Progress Notes (Signed)
Subjective: Stacey Mcdaniel is a 51 y.o. female patient seen in office for follow up of Left heel pain. Patient reports that her heel is better and no longer hurts. Reports that the met padding helped but has a little spot on the bottom of her left foot, does not hurt but wants to discuss the cause.  Patient denies any other pedal complaints. Denies any constitutional symptoms at this time besides cough that is getting better.  There are no active problems to display for this patient.   Current Outpatient Medications on File Prior to Visit  Medication Sig Dispense Refill  . levothyroxine (SYNTHROID, LEVOTHROID) 125 MCG tablet Take by mouth.    . naproxen (NAPROSYN) 250 MG tablet Take 1 tablet (250 mg total) by mouth 2 (two) times daily with a meal. 30 tablet 0   Current Facility-Administered Medications on File Prior to Visit  Medication Dose Route Frequency Provider Last Rate Last Dose  . triamcinolone acetonide (KENALOG) 10 MG/ML injection 10 mg  10 mg Other Once Landis Martins, DPM        No Known Allergies  Objective: Physical Exam General: The patient is alert and oriented x3 in no acute distress.  Dermatology: Skin is warm, dry and supple bilateral lower extremities. Nails 1-10 are normal. There is no erythema, edema, no eccymosis, no open lesions present.  Minimal keratosis 5 bilateral and sub met 2-3 on left, integument is otherwise unremarkable.  Vascular: Dorsalis Pedis pulse and Posterior Tibial pulse are 2/4 bilateral. Capillary fill time is immediate to all digits.  Neurological: Grossly intact to light touch with an achilles reflex of +2/5 and a  negative Tinel's sign bilateral.  Musculoskeletal: No tenderness to palpation at the medial calcaneal tubercale and through the insertion of the plantar fascia on the left foot. No reproducible tenderness to balls on today's visit.  No pain with compression of calcaneus bilateral. No pain with tuning fork to calcaneus bilateral.  No pain with calf compression bilateral. There is decreased Ankle joint range of motion bilateral. All other joints range of motion within normal limits bilateral. Strength 5/5 in all groups bilateral.   Assessment and Plan: Problem List Items Addressed This Visit    None    Visit Diagnoses    Plantar fasciitis, left    -  Primary   Porokeratosis         -Complete examination performed.  -Applied salinocaine to areas of keratosis on left -No additional treatment for heel pain since patient is doing well with no symptoms at this time -Recommend continue with good supportive shoes, we will send her orthotics back to add more padding throughout the entire forefoot; patient to drop them off by office on tomorrow  -Patient to return to office to pick up modified orthotics when ready or sooner if problems or questions arise.  Landis Martins, DPM

## 2017-09-03 ENCOUNTER — Ambulatory Visit (INDEPENDENT_AMBULATORY_CARE_PROVIDER_SITE_OTHER): Payer: Managed Care, Other (non HMO)

## 2017-09-03 ENCOUNTER — Ambulatory Visit: Payer: Managed Care, Other (non HMO) | Admitting: Podiatry

## 2017-09-03 ENCOUNTER — Encounter: Payer: Self-pay | Admitting: Podiatry

## 2017-09-03 DIAGNOSIS — S99922A Unspecified injury of left foot, initial encounter: Secondary | ICD-10-CM | POA: Diagnosis not present

## 2017-09-03 DIAGNOSIS — R600 Localized edema: Secondary | ICD-10-CM

## 2017-09-04 NOTE — Progress Notes (Signed)
Subjective:   Patient ID: Stacey Mcdaniel, female   DOB: 51 y.o.   MRN: [id number]   HPI Patient presents with severe fracture of the left fifth metatarsal and stated that it just happened approximately 1 day ago.  She apparently fell down 2 steps and felt a snap and it has been very sore and she is been using crutches   ROS      Objective:  Physical Exam  Neurovascular status intact negative Homans sign was noted with quite a bit of forefoot edema left and into the midfoot and ankle +2 pitting with exquisite discomfort around the fifth metatarsal shaft left with patient wearing a posterior splint at the current time     Assessment:  Fracture of the left fifth metatarsal with significant edema and pain     Plan:  H&P condition reviewed and at this time I applied an Unna boot to provide compression and I gave instructions for elevation and applied air fracture walker to mobilize and I am having Dr. Shella SpearingWaggener see this patient next week with probability of surgery to be performed in the next 2 weeks.  He will make that decision based on whether there is any further movement and whether he feels it is unstable or stable and she is reappointed to him in approximately 8 days  X-rays indicate there is a mid shaft fracture of the fifth metatarsal with medial movement of the distal segment and moderate separation on the oblique view

## 2017-09-13 ENCOUNTER — Ambulatory Visit: Payer: Managed Care, Other (non HMO) | Admitting: Podiatry

## 2017-09-13 ENCOUNTER — Ambulatory Visit (INDEPENDENT_AMBULATORY_CARE_PROVIDER_SITE_OTHER): Payer: Managed Care, Other (non HMO)

## 2017-09-13 ENCOUNTER — Telehealth: Payer: Self-pay | Admitting: *Deleted

## 2017-09-13 ENCOUNTER — Encounter: Payer: Self-pay | Admitting: Podiatry

## 2017-09-13 DIAGNOSIS — S92355D Nondisplaced fracture of fifth metatarsal bone, left foot, subsequent encounter for fracture with routine healing: Secondary | ICD-10-CM

## 2017-09-13 NOTE — Telephone Encounter (Signed)
Patient called the [facility] office today and was asking if the inserts were back after being sent out on 08/15/17 and I called [facility] and spoke with Consuella LoseElaine and Consuella Loselaine stated that they were either shipped out yesterday and be here today or ship out today and be here tomorrow and I relayed that message to the patient and the patient was fine with that and I stated I would let her know today around 4 due to UPS or the regular mail has not been by yet. Misty StanleyLisa

## 2017-09-13 NOTE — Telephone Encounter (Signed)
Called patient to let her know that the inserts came by [employer] and  were delivered to the [facility] and the patient stated that she would sing by after being seen in the [place] office today and I stated to the patient if the inserts were not right to call or come by and we would make then right for her. Stacey StanleyLisa

## 2017-09-13 NOTE — Patient Instructions (Signed)

## 2017-09-17 NOTE — Progress Notes (Signed)
Subjective: Stacey Mcdaniel presents to the office today for follow-up evaluation of a fracture of the left fifth metatarsal.  She states that she has been wearing the cam boot but she does admit that she has been putting weight down walking in the boot.  She is hopeful that she can return to work tomorrow.  She states the swelling has improved she denies any recent injury or trauma.Denies any systemic complaints such as fevers, chills, nausea, vomiting. No acute changes since last appointment, and no other complaints at this time.   Objective: AAO x3, NAD DP/PT pulses palpable bilaterally, CRT less than 3 seconds There is tenderness palpation of the left fifth metatarsal and there is direct pain on this area.  There is mild discomfort of the fourth metatarsal head as well.  There is no other pain on the Lisfranc joint or ankle.  Achilles tendon, plantar fascia appear to be intact. No open lesions or pre-ulcerative lesions.  No pain with calf compression, swelling, warmth, erythema  Assessment: Left fifth metatarsal fracture with displacement, fourth metatarsal head fracture  Plan: -All treatment options discussed with the patient including all alternatives, risks, complications.  -X-rays were again reviewed.  There is continued evidence of a radial lucent line infection of the fifth metatarsal diaphysis.  The x-rays do appear to be change and there is displacement of the fracture site. -Given the displacement we discussed conservative as well as surgical treatment options.  I believe that at this point given the displacement it would be best to go ahead and proceed with open reduction internal fixation of the left fifth metatarsal.  I discussed the surgery as well as the postoperative course.  I discussed with her that this is not a guarantee of resolution of symptoms and she may need to have another surgery.  She understands this and she wishes to go ahead and proceed with surgery as soon as possible. -We  will proceed with a left fifth metatarsal open reduction internal fixation next week. -The incision placement as well as the postoperative course was discussed with the patient. I discussed risks of the surgery which include, but not limited to, infection, bleeding, pain, swelling, need for further surgery, delayed or nonhealing, painful or ugly scar, numbness or sensation changes, over/under correction, recurrence, transfer lesions, further deformity, hardware failure, DVT/PE, loss of toe/foot. Patient understands these risks and wishes to proceed with surgery. The surgical consent was reviewed with the patient all 3 pages were signed. No promises or guarantees were given to the outcome of the procedure. All questions were answered to the best of my ability. Before the surgery the patient was encouraged to call the office if there is any further questions. [facility] basis. -Patient encouraged to call the office with any questions, concerns, change in symptoms.   Vivi BarrackMatthew R Jimesha Rising DPM

## 2017-09-19 ENCOUNTER — Encounter: Payer: Self-pay | Admitting: Podiatry

## 2017-09-19 DIAGNOSIS — S92355A Nondisplaced fracture of fifth metatarsal bone, left foot, initial encounter for closed fracture: Secondary | ICD-10-CM

## 2017-09-19 NOTE — Progress Notes (Signed)
Pre-operative Note  [facility] today for surgical intervention of the LEFT foot for ORIF of 5th metatarsal. The surgical consent was reviewed with the patient and we discussed the procedure as well as the postoperative course. I again discussed all alternatives, risks, complications. I answered all of their questions to the best of my ability and they wish to proceed with surgery. No promises or guarantees were given as to the outcome of the surgery.   The surgical consent was signed.   Patient is NPO since midnight.  The patient does not have have a history of blood clots or bleeding disorders.   No further questions.   Ovid CurdMatthew Eivan Gallina, DPM [facility]

## 2017-09-20 ENCOUNTER — Telehealth: Payer: Self-pay | Admitting: *Deleted

## 2017-09-20 NOTE — Telephone Encounter (Signed)
Pt states she has some questions about after surgery. Pt asked if she could take the boot off to take off her sweat pants and to sleep. I told pt to remain in the boot until ready to take the pants off the surgical foot, then sit down and have some remove the boot and the sweat pants then put the boot back on. I told pt she could remove the boot to rest, but if sleeping or walking then must have the boot on. Pt states understanding.

## 2017-09-24 ENCOUNTER — Ambulatory Visit (INDEPENDENT_AMBULATORY_CARE_PROVIDER_SITE_OTHER): Payer: Managed Care, Other (non HMO) | Admitting: Podiatry

## 2017-09-24 ENCOUNTER — Encounter: Payer: Self-pay | Admitting: Podiatry

## 2017-09-24 ENCOUNTER — Ambulatory Visit (INDEPENDENT_AMBULATORY_CARE_PROVIDER_SITE_OTHER): Payer: Managed Care, Other (non HMO)

## 2017-09-24 VITALS — BP 128/84 | HR 78 | Temp 96.3°F

## 2017-09-24 DIAGNOSIS — S92355D Nondisplaced fracture of fifth metatarsal bone, left foot, subsequent encounter for fracture with routine healing: Secondary | ICD-10-CM

## 2017-09-25 DIAGNOSIS — S92355D Nondisplaced fracture of fifth metatarsal bone, left foot, subsequent encounter for fracture with routine healing: Secondary | ICD-10-CM | POA: Insufficient documentation

## 2017-09-25 NOTE — Progress Notes (Signed)
Subjective: Stacey Mcdaniel is a 52 y.o. is seen today in office s/p left 5th metatarsal ORIF preformed on 09/19/2016. They state their pain is controlled.  She takes meloxicam in the mornings and also Percocet at nighttime.  She has been in the cam boot she has been nonweightbearing.  She has no new concerns today.  Denies any systemic complaints such as fevers, chills, nausea, vomiting. No calf pain, chest pain, shortness of breath.   She is eager to get back to work  Objective: General: No acute distress, AAOx3  DP/PT pulses palpable 2/4, CRT < 3 sec to all digits.  Protective sensation intact. Motor function intact.  LEFT foot: Incision is well coapted without any evidence of dehiscence and sutures are intact. There is no surrounding erythema, ascending cellulitis, fluctuance, crepitus, malodor, drainage/purulence. There is mild edema around the surgical site. There is mild pain along the surgical site.  Incision appears to be healing well there is no signs of infection noted. No other areas of tenderness to bilateral lower extremities.  No other open lesions or pre-ulcerative lesions.  No pain with calf compression, swelling, warmth, erythema.   Assessment and Plan:  Status post left 5th metatarsal ORIF, doing well with no complications   -Treatment options discussed including all alternatives, risks, and complications -X-rays were obtained and reviewed with the patient.  Fifth metatarsal appears to be in adequate alignment hardware intact.  There is evidence of healing of the fourth metatarsal neck fracture.  No other evidence of acute fracture identified today. -Dressing was changed.  Antibiotic ointment was applied followed by dry dressing of the bandage.  Keep the dressing clean, dry, intact. -Continue cam boot and nonweightbearing. -Ice/elevation -Pain medication as needed. -Monitor for any clinical signs or symptoms of infection and DVT/PE and directed to call the office immediately  should any occur or go to the ER. -Follow-up 10 days for POSSIBLE suture removal or sooner if any problems arise. In the meantime, encouraged to call the office with any questions, concerns, change in symptoms.   Stacey Mcdaniel, DPM

## 2017-10-01 ENCOUNTER — Ambulatory Visit (INDEPENDENT_AMBULATORY_CARE_PROVIDER_SITE_OTHER): Payer: Managed Care, Other (non HMO)

## 2017-10-01 ENCOUNTER — Ambulatory Visit (INDEPENDENT_AMBULATORY_CARE_PROVIDER_SITE_OTHER): Payer: Managed Care, Other (non HMO) | Admitting: Podiatry

## 2017-10-01 ENCOUNTER — Other Ambulatory Visit: Payer: Self-pay | Admitting: Podiatry

## 2017-10-01 DIAGNOSIS — S92355D Nondisplaced fracture of fifth metatarsal bone, left foot, subsequent encounter for fracture with routine healing: Secondary | ICD-10-CM

## 2017-10-02 NOTE — Progress Notes (Signed)
Subjective: Stacey Mcdaniel is a 52 y.o. is seen today in office s/p left 5th metatarsal ORIF preformed on 09/19/2016. She states that she is not having pain and is controlled.  She has remained nonweightbearing she has been wearing the cam boot.  She does state that she fell one time but denies any injury at the time of the fall.  She has no other concerns today. Denies any systemic complaints such as fevers, chills, nausea, vomiting. No calf pain, chest pain, shortness of breath.   She is eager to get back to work  Objective: General: No acute distress, AAOx3  DP/PT pulses palpable 2/4, CRT < 3 sec to all digits.  Protective sensation intact. Motor function intact.  LEFT foot: Incision is well coapted without any evidence of dehiscence and sutures are intact. There is no surrounding erythema, ascending cellulitis, fluctuance, crepitus, malodor, drainage/purulence. There is mild however improved edema around the surgical site. There is decreased pain along the surgical site.  Incision appears to be healing well there is no signs of infection noted. No other areas of tenderness to bilateral lower extremities.  No other open lesions or pre-ulcerative lesions.  No pain with calf compression, swelling, warmth, erythema.   Assessment and Plan:  Status post left 5th metatarsal ORIF, doing well with no complications   -Treatment options discussed including all alternatives, risks, and complications -X-rays were obtained and reviewed with the patient.  Hardware is intact and fracture site is in stable position.  No changes since the fall.  -Antibiotic ointment was applied followed by dry sterile dressing.  Keep the dressing clean, dry, intact. -Continue cam boot and nonweightbearing. -Ice/elevation -Pain medication as needed. -Monitor for any clinical signs or symptoms of infection and DVT/PE and directed to call the office immediately should any occur or go to the ER. -Follow-up in 1 week for  POSSIBLE suture removal or sooner if any problems arise. In the meantime, encouraged to call the office with any questions, concerns, change in symptoms.   Ovid CurdMatthew Quamesha Mullet, DPM

## 2017-10-08 ENCOUNTER — Ambulatory Visit (INDEPENDENT_AMBULATORY_CARE_PROVIDER_SITE_OTHER): Payer: Managed Care, Other (non HMO) | Admitting: Podiatry

## 2017-10-08 ENCOUNTER — Other Ambulatory Visit: Payer: Self-pay | Admitting: Podiatry

## 2017-10-08 DIAGNOSIS — S92355D Nondisplaced fracture of fifth metatarsal bone, left foot, subsequent encounter for fracture with routine healing: Secondary | ICD-10-CM

## 2017-10-08 DIAGNOSIS — Z09 Encounter for follow-up examination after completed treatment for conditions other than malignant neoplasm: Secondary | ICD-10-CM

## 2017-10-08 MED ORDER — OXYCODONE-ACETAMINOPHEN 5-325 MG PO TABS: 1.0000 | ORAL_TABLET | Freq: Four times a day (QID) | ORAL | 0 refills | Status: DC | PRN

## 2017-10-09 NOTE — Progress Notes (Signed)
Subjective: Stacey GourdLori L Mandt is a 52 y.o. is seen today in office s/p left 5th metatarsal ORIF preformed on 09/19/2016.  She is presents today for suture removal.  She says that she is doing well but she is very eager to start putting pressure on her foot that she wants to go back to work.  She has remain in the cam boot.  She has no other concerns today. Denies any systemic complaints such as fevers, chills, nausea, vomiting. No calf pain, chest pain, shortness of breath.   She is eager to get back to work  Objective: General: No acute distress, AAOx3  DP/PT pulses palpable 2/4, CRT < 3 sec to all digits.  Protective sensation intact. Motor function intact.  LEFT foot: Incision is well coapted without any evidence of dehiscence and sutures are intact. There is no surrounding erythema, ascending cellulitis, fluctuance, crepitus, malodor, drainage/purulence.  There is minimal edema to the area she also states the swelling is much better.  There is no concerning redness or drainage or any signs of infection noted to the incision site.  Incision appears to be healing well.  Minimal tenderness palpation of the surgical site. No other areas of tenderness to bilateral lower extremities.  No other open lesions or pre-ulcerative lesions.  No pain with calf compression, swelling, warmth, erythema.   Assessment and Plan:  Status post left 5th metatarsal ORIF, doing well with no complications   -Treatment options discussed including all alternatives, risks, and complications -Lidocaine ointment was applied to the area to help anesthetize prior to suture removal.  The sutures removed without any complications the incision remains well coapted.  Steri-Strips were applied for reinforcement.  Antibiotic ointment was applied followed by a bandage.  Keep the dressing clean, dry, intact. -Ice and elevation -Continue cam boot and nonweightbearing at all times -Pain medication as needed -Monitor for any clinical  signs or symptoms of infection and directed to call the office immediately should any occur or go to the ER. -Follow-up in 10 days or sooner if needed.  Call any questions or concerns meantime.  Ovid CurdMatthew Kataleyah Carducci, DPM

## 2017-10-16 ENCOUNTER — Ambulatory Visit (INDEPENDENT_AMBULATORY_CARE_PROVIDER_SITE_OTHER): Payer: Managed Care, Other (non HMO)

## 2017-10-16 ENCOUNTER — Ambulatory Visit (INDEPENDENT_AMBULATORY_CARE_PROVIDER_SITE_OTHER): Payer: Managed Care, Other (non HMO) | Admitting: Podiatry

## 2017-10-16 DIAGNOSIS — S92355A Nondisplaced fracture of fifth metatarsal bone, left foot, initial encounter for closed fracture: Secondary | ICD-10-CM

## 2017-10-16 DIAGNOSIS — S92355D Nondisplaced fracture of fifth metatarsal bone, left foot, subsequent encounter for fracture with routine healing: Secondary | ICD-10-CM

## 2017-10-16 NOTE — Progress Notes (Signed)
Subjective: Stacey Mcdaniel is a 52 y.o. is seen today in office s/p left 5th metatarsal ORIF preformed on 09/19/2016.  She states that she is doing "perfect" she has had no pain and no nerve pain.  She states that she is eager to get back to work.  She has been nonweightbearing in the cam boot.  No recent injury.  She has no other concerns today. She denies any swelling or redness that she has noticed.  She has no other concerns today. Denies any systemic complaints such as fevers, chills, nausea, vomiting.   Objective: General: No acute distress, AAOx3  DP/PT pulses palpable 2/4, CRT < 3 sec to all digits.  Protective sensation intact. Motor function intact.  LEFT foot: Incision is well coapted without any evidence and steri-strips are intact. I removed the steri-strips today and there was some loose skin that came off of the incision today. The incision is healing well however. There is no drainage or pus. There is surrounding erythema, ascending cellulitis. No fluctuance or crepitance. No malodor. No pain to the surgical site. Minimal swelling.  No other areas of tenderness to bilateral lower extremities.  No other open lesions or pre-ulcerative lesions.  No pain with calf compression, swelling, warmth, erythema.   Assessment and Plan:  Status post left 5th metatarsal ORIF, doing well with no complications   -Treatment options discussed including all alternatives, risks, and complications -X-rays were obtained and reviewed with the patient.  There is increased consolidation across the fracture of the fifth metatarsal.  Hardware intact without any breakage.  No evidence of acute fracture otherwise.Marland Kitchen. Healing to the fourth metatarsal neck. -Steri-Strips removed today.  Small amount of loose skin macerated skin came off but the incision is healing well there is no opening.  Betadine was applied followed by just a couple of Steri-Strips and a bandage.  She can start to change the bandage on Friday and  will comply small amount of antibiotic ointment. -Continue cam boot at all times but she can start to be partial weightbearing in the cam boot.  She has not yet to return to work.  Discussed continue ice and elevation.  Also if she has any increasing pain or swelling she is to remain nonweightbearing and call the office -Follow-up in 2 weeks or sooner if needed.  Call any questions or concerns meantime.  She agrees with this plan  *x-ray next appointment   Vivi BarrackMatthew R Wagoner DPM    -Lidocaine ointment was applied to the area to help anesthetize prior to suture removal.  The sutures removed without any complications the incision remains well coapted.  Steri-Strips were applied for reinforcement.  Antibiotic ointment was applied followed by a bandage.  Keep the dressing clean, dry, intact. -Ice and elevation -Continue cam boot and nonweightbearing at all times -Pain medication as needed -Monitor for any clinical signs or symptoms of infection and directed to call the office immediately should any occur or go to the ER. -Follow-up in 10 days or sooner if needed.  Call any questions or concerns meantime.  Ovid CurdMatthew Wagoner, DPM

## 2017-10-17 ENCOUNTER — Telehealth: Payer: Self-pay | Admitting: Podiatry

## 2017-10-17 NOTE — Telephone Encounter (Signed)
I spoke with Dr. Ardelle AntonWagoner concerning pt's directions for 10/19/2017. Dr. Ardelle AntonWagoner states he had told pt to have her nephew remove the dressing and put a lite coating of antibiotic ointment or betadine on the suture line about every other day or after showering if the suture line looks good and she feels comfortable, but pt may also leave the dressing in place until seen by Dr. Ardelle AntonWagoner. I informed pt of Dr. Gabriel RungWagoner's orders and she asked if she took the steri-strips off, and I told her no, they would gradually come off when she put on the antibiotic ointment. Pt states understanding.

## 2017-10-17 NOTE — Telephone Encounter (Signed)
I have a question about my visit with Dr. Ardelle AntonWagoner yesterday. He told me to do something on Friday but I'm not sure if he wanted me to take my steri-strips off. If you could find out and let me know. My number is [phone]. Thank you and enjoy your day.

## 2017-10-29 ENCOUNTER — Telehealth: Payer: Self-pay | Admitting: Podiatry

## 2017-10-29 ENCOUNTER — Encounter: Payer: Self-pay | Admitting: Podiatry

## 2017-10-29 ENCOUNTER — Ambulatory Visit (INDEPENDENT_AMBULATORY_CARE_PROVIDER_SITE_OTHER): Payer: Managed Care, Other (non HMO) | Admitting: Podiatry

## 2017-10-29 ENCOUNTER — Ambulatory Visit (INDEPENDENT_AMBULATORY_CARE_PROVIDER_SITE_OTHER): Payer: Managed Care, Other (non HMO)

## 2017-10-29 DIAGNOSIS — S92355D Nondisplaced fracture of fifth metatarsal bone, left foot, subsequent encounter for fracture with routine healing: Secondary | ICD-10-CM

## 2017-10-29 NOTE — Telephone Encounter (Signed)
Called pt and let her know that I got her note updated and faxed to [phone]. Pt stated thank you.

## 2017-10-29 NOTE — Telephone Encounter (Signed)
I was wondering if you can do another note and fax to my job. I just need it to say that I can work 5 hours leave off the end where it says a break every 2 hours. Can you just write it that I'm allowed to return to work for 5 hours. The fax number to send it to is [phone] and I can be reached at [phone]. Thank you.

## 2017-11-04 NOTE — Progress Notes (Signed)
Subjective: Stacey Mcdaniel is a 52 y.o. is seen today in office s/p left 5th metatarsal ORIF preformed on 09/19/2016.  She states that she has been doing well.  She is very eager to go back to work.  She has remained nonweightbearing and using her knee scooter.  No recent injury or trauma since I last saw her.  She has not got the incision wet.  She states that she is not taking any pain medicine. She has no other concerns today. Denies any systemic complaints such as fevers, chills, nausea, vomiting.   Objective: General: No acute distress, AAOx3  DP/PT pulses palpable 2/4, CRT < 3 sec to all digits.  Protective sensation intact. Motor function intact.  LEFT foot: Incision is well coapted without any evidence however there is mild macerated tissue on the area.  I did clean this part of the incision as well as the macerated tissue small superficial abrasion type lesion present and this seems to be worse scab has come off along the incision itself but there is no drainage or pus or any probing of the skin appears to be coapted and this is very superficial outer layers of the skin.  There is minimal edema there is no surrounding erythema, ascending cellulitis.  There is no fluctuation or crepitation.  There is no malodor.  There is no clinical signs of infection noted.  Mild tenderness palpation of the surgical site.  No other areas of tenderness to bilateral lower extremities.  No other open lesions or pre-ulcerative lesions.  No pain with calf compression, swelling, warmth, erythema.   Assessment and Plan:  Status post left 5th metatarsal ORIF, doing well with no complications   -Treatment options discussed including all alternatives, risks, and complications -X-rays were obtained and reviewed with the patient.  There is increased consolidation across the fracture of the fifth metatarsal however still able to visualize radiolucent line consistent with a fracture.  Hardware intact without any  breakage.   -At this time we can slowly start to transition to partial weightbearing in the cam boot.  She really wants to go back to work and she is to go back to work on a part-time basis 5 hours a day sitting every 2 hours.  She understands that she has any problems doing this or she has any increase in pain or swelling or any issues with the incision she is not to go back to work and hold off on this but she is going to try.  She must be in the cam boot at all times.  I want her to ice and elevate as much as possible. -Monitor for any clinical signs or symptoms of infection and directed to call the office immediately should any occur or go to the ER. -Follow-up as scheduled.  Call any questions or concerns.  She is very thankful for today's care and she states that she is doing very well at this point and she is very pleased.  Vivi BarrackMatthew R Kotaro Buer DPM

## 2017-11-06 ENCOUNTER — Telehealth: Payer: Self-pay | Admitting: *Deleted

## 2017-11-06 NOTE — Telephone Encounter (Signed)
Stacey Mcdaniel - [employer] states she would like to know the status of their last fax.

## 2017-11-19 ENCOUNTER — Encounter: Payer: Self-pay | Admitting: Podiatry

## 2017-11-19 ENCOUNTER — Ambulatory Visit (INDEPENDENT_AMBULATORY_CARE_PROVIDER_SITE_OTHER): Payer: Managed Care, Other (non HMO) | Admitting: Podiatry

## 2017-11-19 ENCOUNTER — Ambulatory Visit: Payer: Managed Care, Other (non HMO)

## 2017-11-19 DIAGNOSIS — S92355D Nondisplaced fracture of fifth metatarsal bone, left foot, subsequent encounter for fracture with routine healing: Secondary | ICD-10-CM

## 2017-11-19 NOTE — Progress Notes (Signed)
Subjective: Stacey Mcdaniel is a 52 y.o. is seen today in office s/p left 5th metatarsal ORIF preformed on 09/19/2016. She states that she is doing much better.  She is working 5 hours in the boot.  She has minimal pain with working and she states the swelling is much improved.  Denies any redness or warmth.  She has gone for short distances barefoot around of her house and she states that she has had no pain while doing this.  She denies any recent injury or since last appointment she has no new concerns.  She has no other complaints today. Denies any systemic complaints such as fevers, chills, nausea, vomiting.   Objective: General: No acute distress, AAOx3  DP/PT pulses palpable 2/4, CRT < 3 sec to all digits.  Protective sensation intact. Motor function intact.  LEFT foot: Incision is well coapted and the scar is well formed.  There is no significant tenderness palpation to the surgical site.  There is very minimal edema to the foot and there is no erythema or increased warmth.  There is numbness gently along the incision.  There is no radiating pain.  No other areas of tenderness.  No pain over fourth metatarsal. No other areas of tenderness to bilateral lower extremities.  No other open lesions or pre-ulcerative lesions.  No pain with calf compression, swelling, warmth, erythema.   Assessment and Plan:  Status post left 5th metatarsal ORIF, doing well with no complications   -Treatment options discussed including all alternatives, risks, and complications -X-rays were obtained and reviewed with the patient.  There is increased consolidation across the fracture of the fifth metatarsal, only able to see a small radial lucent line on the oblique view.  Fourth metatarsal neck fracture is healing well.  There is no evidence of acute fracture and hardware intact. -At this point she can return to work a full 8 hours in the boot.  I want her to slowly start to transition to a sneaker around the house.   When she is comfortable wearing a sneaker around the house she can slowly start to wear tennis shoe to work on a part-time basis and gradually increase nighttime that she is wearing a shoe.  Continue ice elevation as well as compression to help with swelling although minimal today. -She is not taking any pain medication. -Follow-up in 3 weeks or sooner if needed.  Call any questions or concerns meantime.  Vivi BarrackMatthew R Zacchaeus Halm DPM

## 2017-12-06 ENCOUNTER — Telehealth: Payer: Self-pay | Admitting: *Deleted

## 2017-12-07 NOTE — Telephone Encounter (Signed)
Pt states she needs a note for work stating it is okay to work not in the boot or parttime in the boot, Dr. Ardelle AntonWagoner had said she could transition to a regular shoe and work.

## 2017-12-07 NOTE — Telephone Encounter (Signed)
I spoke with pt and she states they allowed her to slide yesterday, and she is off today and her appt is Monday and she will just get it taken care of then.

## 2017-12-10 ENCOUNTER — Ambulatory Visit (INDEPENDENT_AMBULATORY_CARE_PROVIDER_SITE_OTHER): Payer: Managed Care, Other (non HMO)

## 2017-12-10 ENCOUNTER — Ambulatory Visit (INDEPENDENT_AMBULATORY_CARE_PROVIDER_SITE_OTHER): Payer: Managed Care, Other (non HMO) | Admitting: Podiatry

## 2017-12-10 DIAGNOSIS — S92355D Nondisplaced fracture of fifth metatarsal bone, left foot, subsequent encounter for fracture with routine healing: Secondary | ICD-10-CM

## 2017-12-10 NOTE — Progress Notes (Signed)
Subjective: Stacey GourdLori L Sangiovanni is a 52 y.o. is seen today in office s/p left 5th metatarsal ORIF preformed on 09/19/2016. She states that she is doing great and states "I am ready to run a 5K". Over the last week she has not been in the boot and she is working her full 8 hours at work in a regular shoe with no issue.  She wants to be released to go back to work full-time without any restrictions.  She has some occasional swelling and pain most of the end of the day after she is been on her feet but otherwise she states that she is doing much better she has no other concerns today. Denies any systemic complaints such as fevers, chills, nausea, vomiting.   Objective: General: No acute distress, AAOx3  DP/PT pulses palpable 2/4, CRT < 3 sec to all digits.  Protective sensation intact. Motor function intact.  LEFT foot: Incision is well coapted and the scar is well formed.  There is no tenderness to palpation to the surgical site.  There is trace edema to the foot and there is no erythema or increased warmth.  There is also mild numbness along the incision directly with there is no sharp or shooting pains down the foot or up the leg. No other areas of tenderness identified at this time. No other open lesions or pre-ulcerative lesions.  No pain with calf compression, swelling, warmth, erythema.   Assessment and Plan:  Status post left 5th metatarsal ORIF, doing well with no complications   -Treatment options discussed including all alternatives, risks, and complications -X-rays were obtained and reviewed with the patient.  There is increased consolidation across the fracture of the fifth metatarsal.  Hardware intact. -At this point she is doing well she is able to work a full shift in a regular shoe with no significant discomfort.  After where she gets some discomfort although mild.  I will release her to go up to work full-time without any restrictions.  I discussed as to when her wear the compression while  working as well as she comes home to go ahead and ice and elevate to help prevent pain.  Number to release her from the postoperative course that she is doing well but I encouraged her to call any questions or concerns or any change in symptoms.  She declines physical therapy.  Vivi BarrackMatthew R Wagoner DPM

## 2017-12-10 NOTE — Progress Notes (Signed)
Dg foot  

## 2018-03-05 ENCOUNTER — Ambulatory Visit (INDEPENDENT_AMBULATORY_CARE_PROVIDER_SITE_OTHER): Payer: Managed Care, Other (non HMO)

## 2018-03-05 ENCOUNTER — Encounter: Payer: Self-pay | Admitting: Podiatry

## 2018-03-05 ENCOUNTER — Ambulatory Visit: Payer: Managed Care, Other (non HMO) | Admitting: Podiatry

## 2018-03-05 DIAGNOSIS — M79672 Pain in left foot: Secondary | ICD-10-CM

## 2018-03-05 DIAGNOSIS — M722 Plantar fascial fibromatosis: Secondary | ICD-10-CM

## 2018-03-05 DIAGNOSIS — S92355D Nondisplaced fracture of fifth metatarsal bone, left foot, subsequent encounter for fracture with routine healing: Secondary | ICD-10-CM | POA: Diagnosis not present

## 2018-03-05 MED ORDER — TRIAMCINOLONE ACETONIDE 10 MG/ML IJ SUSP
10.0000 mg | Freq: Once | INTRAMUSCULAR | Status: AC
  Administered 2018-03-05: 10 mg

## 2018-03-05 MED ORDER — DICLOFENAC SODIUM 1 % TD GEL: 2.0000 g | Freq: Four times a day (QID) | TRANSDERMAL | 2 refills | Status: DC

## 2018-03-05 NOTE — Progress Notes (Signed)
Subjective: Ms. Carolyn StareCallicutt presents the office today for concerns of her left heel hurting over the last 3 weeks.  She has a history of plantar fasciitis and she is requesting a steroid injection for the left heel.  She states that the end of the day she still gets some swelling to the foot and some pain she points on the midfoot area most where she was majority of symptoms.  She also gets some discomfort and swelling towards the surgical site as well.  She denies any recent injury or falls.  She has no other concerns today. Denies any systemic complaints such as fevers, chills, nausea, vomiting. No acute changes since last appointment, and no other complaints at this time.   Objective: AAO x3, NAD DP/PT pulses palpable bilaterally, CRT less than 3 seconds There is tenderness palpation of the plantar medial tubercle of the calcaneus at the insertion of plantar fascia left foot.  No pain on the course the plantar fascial the arch of the foot.  No pain with lateral compression.  Minimal discomfort directly along the incision from the prior surgery but there is no pinpoint tenderness.  Also mild discomfort on the dorsal medial Lisfranc joint but again there is no area of specific pinpoint tenderness.  There is no significant swelling to the area today and subjectively she states that gets worse after being on her feet all day at work. No open lesions or pre-ulcerative lesions.  No pain with calf compression, swelling, warmth, erythema  Assessment: Left plantar fasciitis, midfoot pain; history of fifth metatarsal fracture  Plan: -All treatment options discussed with the patient including all alternatives, risks, complications.  -X-rays were obtained reviewed.  No evidence of acute fracture or stress fracture.  Hardware intact.  Does appear to have consolidation across the fracture site. -The dorsal plantar fashion is a steroid injection was performed.  Continue orthotics.  I have Rick evaluate her  orthotics today.  Stretching, icing exercises daily.  She declined a Medrol Dosepak.  I prescribed Voltaren gel.  Also the Voltaren gel also help with the dorsal midfoot pain as well as along the surgical site.  I did again discuss physical therapy but she declines this as well. -If heel pain resolves and she continues to have midfoot pain we will consider steroid injection to the area as well. -Patient encouraged to call the office with any questions, concerns, change in symptoms.   Procedure: Injection Tendon/Ligament Discussed alternatives, risks, complications and verbal consent was obtained.  Location: Left plantar fascia at the glabrous junction; medial approach. Skin Prep: Alcohol. Injectate: 0.5cc 0.5% marcaine plain, 0.5 cc 2% lidocaine plain and, 1 cc kenalog 10. Disposition: Patient tolerated procedure well. Injection site dressed with a band-aid.  Post-injection care was discussed and return precautions discussed.   Vivi BarrackMatthew R Darly Fails DPM

## 2018-04-01 ENCOUNTER — Ambulatory Visit: Payer: Managed Care, Other (non HMO) | Admitting: Podiatry

## 2018-04-01 ENCOUNTER — Other Ambulatory Visit: Payer: Managed Care, Other (non HMO) | Admitting: Orthotics

## 2019-02-06 ENCOUNTER — Other Ambulatory Visit: Payer: Self-pay

## 2019-02-06 ENCOUNTER — Ambulatory Visit: Payer: 59 | Admitting: Podiatry

## 2019-02-06 ENCOUNTER — Ambulatory Visit: Payer: Managed Care, Other (non HMO) | Admitting: Podiatry

## 2019-02-06 ENCOUNTER — Encounter: Payer: Self-pay | Admitting: Podiatry

## 2019-02-06 ENCOUNTER — Ambulatory Visit (INDEPENDENT_AMBULATORY_CARE_PROVIDER_SITE_OTHER): Payer: 59

## 2019-02-06 VITALS — Temp 97.5°F

## 2019-02-06 DIAGNOSIS — M779 Enthesopathy, unspecified: Secondary | ICD-10-CM

## 2019-02-06 DIAGNOSIS — M7752 Other enthesopathy of left foot: Secondary | ICD-10-CM

## 2019-02-06 MED ORDER — MELOXICAM 15 MG PO TABS: 15.0000 mg | ORAL_TABLET | Freq: Every day | ORAL | 0 refills | Status: AC

## 2019-02-06 MED ORDER — TRIAMCINOLONE ACETONIDE 10 MG/ML IJ SUSP
10.0000 mg | Freq: Once | INTRAMUSCULAR | Status: AC
  Administered 2019-02-06: 09:00:00 10 mg

## 2019-02-06 NOTE — Progress Notes (Signed)
Subjective: Jeanene presents the office today for concerns of pain to the top of her left foot which is been ongoing for last 3 months.  She had some swelling to the top of her foot as well after she has been at work all day.  She denies any recent injury or trauma.  She had some burning pain in the day when her shoes come off.  From a fracture standpoint.  She is healed well and she has no concerns of the surgery. Denies any systemic complaints such as fevers, chills, nausea, vomiting. No acute changes since last appointment, and no other complaints at this time.   Objective: AAO x3, NAD DP/PT pulses palpable bilaterally, CRT less than 3 seconds There is tenderness palpation of the dorsal aspect midfoot on the "joint of the second, third metatarsal cuneiform joint.  There is localized edema there is no erythema or warmth.  There is no pain to the fifth metatarsal area fracture or previous surgery.  Negative Tinel sign. No open lesions or pre-ulcerative lesions.  No pain with calf compression, swelling, warmth, erythema  Assessment: Capsulitis, tendinitis left midfoot  Plan: -All treatment options discussed with the patient including all alternatives, risks, complications.  -X-rays reviewed.  Mild arthritic changes present to the Lisfranc joint.  No evidence of acute fracture. -Steroid performed.  See procedure note below. -Prescribed mobic. Discussed side effects of the medication and directed to stop if any are to occur and call the office.  -Ice -Shoe modifications -Patient encouraged to call the office with any questions, concerns, change in symptoms.   Procedure: Injection Tendon/Ligament Discussed alternatives, risks, complications and verbal consent was obtained.  Location: Left midfoot Skin Prep:Alcohol Injectate: 0.5cc 0.5% marcaine plain, 0.5 cc 2% lidocaine plain and, 1 cc kenalog 10. Disposition: Patient tolerated procedure well. Injection site dressed with a band-aid.   Post-injection care was discussed and return precautions discussed.    Vivi Barrack DPM

## 2019-03-06 ENCOUNTER — Ambulatory Visit: Payer: 59 | Admitting: Podiatry

## 2019-07-09 ENCOUNTER — Other Ambulatory Visit: Payer: Self-pay | Admitting: Family Medicine

## 2019-07-09 DIAGNOSIS — E049 Nontoxic goiter, unspecified: Secondary | ICD-10-CM

## 2019-07-24 ENCOUNTER — Ambulatory Visit
Admission: RE | Admit: 2019-07-24 | Discharge: 2019-07-24 | Disposition: A | Discharge status: Home / Self Care | Payer: 59 | Source: Ambulatory Visit | Attending: Family Medicine | Admitting: Family Medicine

## 2019-07-24 DIAGNOSIS — E049 Nontoxic goiter, unspecified: Secondary | ICD-10-CM

## 2019-08-22 ENCOUNTER — Other Ambulatory Visit: Payer: Self-pay

## 2019-08-22 ENCOUNTER — Ambulatory Visit (INDEPENDENT_AMBULATORY_CARE_PROVIDER_SITE_OTHER): Payer: Managed Care, Other (non HMO) | Admitting: Sports Medicine

## 2019-08-22 ENCOUNTER — Other Ambulatory Visit: Payer: Self-pay | Admitting: Sports Medicine

## 2019-08-22 ENCOUNTER — Encounter: Payer: Self-pay | Admitting: Sports Medicine

## 2019-08-22 DIAGNOSIS — Q828 Other specified congenital malformations of skin: Secondary | ICD-10-CM

## 2019-08-22 DIAGNOSIS — M79672 Pain in left foot: Secondary | ICD-10-CM

## 2019-08-22 DIAGNOSIS — M19079 Primary osteoarthritis, unspecified ankle and foot: Secondary | ICD-10-CM

## 2019-08-22 DIAGNOSIS — M79671 Pain in right foot: Secondary | ICD-10-CM | POA: Diagnosis not present

## 2019-08-22 DIAGNOSIS — Z8781 Personal history of (healed) traumatic fracture: Secondary | ICD-10-CM

## 2019-08-22 MED ORDER — DICLOFENAC SODIUM 1 % EX GEL: 4.0000 g | Freq: Four times a day (QID) | CUTANEOUS | 1 refills | Status: DC

## 2019-08-22 NOTE — Progress Notes (Signed)
Subjective: Stacey Mcdaniel is a 53 y.o. female patient who presents to office for evaluation of Right> Left foot pain secondary to callus skin. Patient complains of pain at the lesion present Right 5th MTPJ.  Patient has tried good shoes with no relief in symptoms. Patient reports that she still has pain on left foot with burning, aching, swelling across the toe. Using compression anklet while at work that helps swelling but not the pain. Patient denies any other pedal complaints.   Patient Active Problem List   Diagnosis Date Noted  . Nondisplaced fracture of fifth left metatarsal bone with routine healing 09/25/2017    Current Outpatient Medications on File Prior to Visit  Medication Sig Dispense Refill  . buPROPion (WELLBUTRIN XL) 150 MG 24 hr tablet     . levothyroxine (SYNTHROID, LEVOTHROID) 125 MCG tablet Take by mouth.    . meloxicam (MOBIC) 15 MG tablet Take 1 tablet (15 mg total) by mouth daily. 30 tablet 0   Current Facility-Administered Medications on File Prior to Visit  Medication Dose Route Frequency Provider Last Rate Last Dose  . triamcinolone acetonide (KENALOG) 10 MG/ML injection 10 mg  10 mg Other Once Landis Martins, DPM        No Known Allergies  Objective:  General: Alert and oriented x3 in no acute distress  Dermatology: Keratotic lesion present sub met 5 on right with skin lines transversing the lesion, pain is present with direct pressure to the lesion with a central nucleated core noted, no webspace macerations, no ecchymosis bilateral, Old scar left foot,  all nails x 10 are well manicured.  Vascular: Dorsalis Pedis and Posterior Tibial pedal pulses 2/4, Capillary Fill Time 3 seconds, + pedal hair growth bilateral, no edema bilateral lower extremities, Temperature gradient within normal limits.  Neurology: Gross sensation intact via light touch bilateral. Subjective burning on left.   Musculoskeletal: Mild tenderness with palpation at the keratotic lesion  site on Right, Muscular strength 5/5 in all groups except on left with mild guarding. Mild pain to dorsal midfoot on left.   Assessment and Plan: Problem List Items Addressed This Visit    None    Visit Diagnoses    Pain of left midfoot    -  Primary   Arthritis, midfoot       Left foot pain       History of fracture       Porokeratosis       Right foot pain          -Complete examination performed -Discussed treatment options for left foot arthritis and porokeratosis on right -Parred keratoic lesion using a chisel blade; treated the area withSalinocaine covered with bandaid on right -Encouraged daily skin emollients -Encouraged use of pumice stone -Advised good supportive shoes -Advised patient to continue with compression sleeve on left -Rx Voltaren topical for left foot  -Patient to return to office as needed or sooner if condition worsens.  Landis Martins, DPM

## 2019-09-04 ENCOUNTER — Ambulatory Visit: Payer: 59 | Admitting: Sports Medicine

## 2019-12-12 ENCOUNTER — Encounter: Payer: Self-pay | Admitting: Sports Medicine

## 2019-12-12 ENCOUNTER — Telehealth: Payer: Self-pay | Admitting: *Deleted

## 2019-12-12 ENCOUNTER — Other Ambulatory Visit: Payer: Self-pay

## 2019-12-12 ENCOUNTER — Ambulatory Visit: Payer: 59 | Admitting: Sports Medicine

## 2019-12-12 DIAGNOSIS — M79671 Pain in right foot: Secondary | ICD-10-CM | POA: Diagnosis not present

## 2019-12-12 DIAGNOSIS — M19079 Primary osteoarthritis, unspecified ankle and foot: Secondary | ICD-10-CM

## 2019-12-12 DIAGNOSIS — M79672 Pain in left foot: Secondary | ICD-10-CM

## 2019-12-12 DIAGNOSIS — I739 Peripheral vascular disease, unspecified: Secondary | ICD-10-CM

## 2019-12-12 DIAGNOSIS — R609 Edema, unspecified: Secondary | ICD-10-CM

## 2019-12-12 DIAGNOSIS — Q828 Other specified congenital malformations of skin: Secondary | ICD-10-CM

## 2019-12-12 NOTE — Telephone Encounter (Signed)
-----   Message from Asencion Islam, [place] sent at 12/12/2019  9:18 AM EDT ----- Regarding: Venous reflux studies Swelling L>R lower extremities  Patient is welling to go to [facility] for studies  She said if she doesn't answer leave a message

## 2019-12-12 NOTE — Progress Notes (Addendum)
Subjective: Stacey Mcdaniel is a 54 y.o. female patient who presents to office for evaluation of Right> Left foot pain secondary to callus skin. Patient also complains of continued pain and swelling in Left>Right foot, voltaren helps but nothing has helped swelling even compression stockings.. Patient denies any other pedal complaints.   Patient Active Problem List   Diagnosis Date Noted  . Nondisplaced fracture of fifth left metatarsal bone with routine healing 09/25/2017    Current Outpatient Medications on File Prior to Visit  Medication Sig Dispense Refill  . buPROPion (WELLBUTRIN XL) 150 MG 24 hr tablet     . dexmethylphenidate (FOCALIN XR) 20 MG 24 hr capsule     . diclofenac Sodium (VOLTAREN) 1 % GEL Apply 4 g topically 4 (four) times daily. 100 g 1  . KLS OMPERAZOLE 20 MG TBEC Take 1 tablet by mouth daily.    Marland Kitchen levothyroxine (SYNTHROID, LEVOTHROID) 125 MCG tablet Take by mouth.    . meloxicam (MOBIC) 15 MG tablet Take 1 tablet (15 mg total) by mouth daily. 30 tablet 0  . valACYclovir (VALTREX) 1000 MG tablet      Current Facility-Administered Medications on File Prior to Visit  Medication Dose Route Frequency Provider Last Rate Last Admin  . triamcinolone acetonide (KENALOG) 10 MG/ML injection 10 mg  10 mg Other Once Landis Martins, DPM        No Known Allergies  Objective:  General: Alert and oriented x3 in no acute distress  Dermatology: Keratotic lesion present sub met 5 on left and right 2nd toe with skin lines transversing the lesion, pain is present with direct pressure to the lesion with a central nucleated core noted, no webspace macerations, no ecchymosis bilateral, Old scar left foot,  all nails x 10 are well manicured.  Vascular: Dorsalis Pedis and Posterior Tibial pedal pulses 2/4, Capillary Fill Time 3 seconds, + pedal hair growth bilateral, no edema bilateral lower extremities, Temperature gradient within normal limits.  Neurology: Gross sensation intact via  light touch bilateral. Subjective burning on left.   Musculoskeletal: Mild tenderness with palpation at the keratotic lesion site on Right>left, Muscular strength 5/5 in all groups except on left with mild guarding. Mild pain to dorsal midfoot on left with chronic swelling.   Assessment and Plan: Problem List Items Addressed This Visit    None    Visit Diagnoses    Swelling    -  Primary   PVD (peripheral vascular disease) ([place])       Porokeratosis       Right foot pain       Left foot pain       Arthritis, midfoot       Pain of left midfoot          -Complete examination performed -Re-Discussed treatment options for left foot arthritis and porokeratosis on right -Parred keratoic lesion using a chisel blade; treated the area withSalinocaine covered with bandaid on right and left at no charge -Encouraged daily skin emollients, PROVIDED SAMPLE OF FOOT MIRACLE CREAM -Encouraged use of pumice stone -Advised good supportive shoes -Advised patient to continue with compression sleeve on left  -Rx Venous reflux studies  -Continue with  Voltaren topical for left foot  -Patient to return to office after vascular studies or sooner if condition worsens.  Landis Martins, DPM

## 2019-12-12 NOTE — Telephone Encounter (Signed)
Faxed orders to [facility]. 

## 2019-12-15 ENCOUNTER — Ambulatory Visit (HOSPITAL_COMMUNITY)
Admission: RE | Admit: 2019-12-15 | Discharge: 2019-12-15 | Disposition: A | Discharge status: Home / Self Care | Payer: 59 | Source: Ambulatory Visit | Attending: Internal Medicine | Admitting: Internal Medicine

## 2019-12-15 ENCOUNTER — Other Ambulatory Visit: Payer: Self-pay

## 2019-12-15 DIAGNOSIS — I739 Peripheral vascular disease, unspecified: Secondary | ICD-10-CM

## 2019-12-15 DIAGNOSIS — R609 Edema, unspecified: Secondary | ICD-10-CM | POA: Insufficient documentation

## 2019-12-17 ENCOUNTER — Telehealth: Payer: Self-pay

## 2019-12-17 NOTE — Telephone Encounter (Signed)
-----   Message from [place], [place] sent at 12/15/2019  7:25 PM EDT ----- Will you let patient know that her veins in her left leg are normal no reflux but interestingly there is reflux or lazy veins on the right. Treatment is compression stockings recommend 15-89mmHg (can get them at [facility]) If she still is concerned about the swelling I can refer her to see a vein and vascular specialist to see what else they recommend -Dr. Kathie Rhodes

## 2019-12-17 NOTE — Telephone Encounter (Signed)
Called and spoke with Pt about her vein study results. Pt was advised to get compression stockings (15-98mmHg ) from [facility]. Pt was also advised if she is still concerned about swellings we can refer her to see a vein and vascular specialist. Pt. Stated understanding

## 2019-12-22 NOTE — Telephone Encounter (Signed)
12-76mmHg will work as well Any level of compression will be helpful but do not go over -Dr Kathie Rhodes

## 2019-12-22 NOTE — Telephone Encounter (Signed)
Called pt back and suggested Dr's new recommendations/size of compression stockings.  Pt states they only carry 8-15, 15-20, 20-30, 30-40, what would your best suggestion be Dr. Marylene Land."

## 2019-12-22 NOTE — Telephone Encounter (Signed)
Pt called saying that [facility] doesn't have compression stockings in 15-30mmHg. Is there another size she should get?

## 2019-12-22 NOTE — Telephone Encounter (Signed)
Dr. Marylene Land Pt called stating [facility] doesn't have compression stockings in 15-30mmHG. Is there another size she can get? Please advice

## 2019-12-23 NOTE — Telephone Encounter (Signed)
Called pt and advised her of the Dr's recommendation for the compression stockings. Pt stated understanding

## 2019-12-23 NOTE — Telephone Encounter (Signed)
15-20 would be best -Dr. Marylene Land

## 2020-08-05 ENCOUNTER — Encounter (HOSPITAL_COMMUNITY): Admission: EM | Disposition: A | Discharge status: Home Health | Payer: Self-pay | Source: Home / Self Care

## 2020-08-05 ENCOUNTER — Emergency Department (HOSPITAL_COMMUNITY): Payer: 59

## 2020-08-05 ENCOUNTER — Encounter (HOSPITAL_COMMUNITY): Payer: Self-pay | Admitting: Emergency Medicine

## 2020-08-05 ENCOUNTER — Inpatient Hospital Stay (HOSPITAL_COMMUNITY): Payer: 59 | Admitting: Anesthesiology

## 2020-08-05 ENCOUNTER — Inpatient Hospital Stay (HOSPITAL_COMMUNITY)
Admission: EM | Admit: 2020-08-05 | Discharge: 2020-08-11 | DRG: 329 | Disposition: A | Discharge status: Home Health | Payer: 59 | Attending: Physician Assistant | Admitting: Physician Assistant

## 2020-08-05 DIAGNOSIS — R739 Hyperglycemia, unspecified: Secondary | ICD-10-CM | POA: Diagnosis not present

## 2020-08-05 DIAGNOSIS — Z9884 Bariatric surgery status: Secondary | ICD-10-CM | POA: Diagnosis not present

## 2020-08-05 DIAGNOSIS — J1282 Pneumonia due to coronavirus disease 2019: Secondary | ICD-10-CM | POA: Diagnosis present

## 2020-08-05 DIAGNOSIS — U071 COVID-19: Secondary | ICD-10-CM | POA: Diagnosis present

## 2020-08-05 DIAGNOSIS — K635 Polyp of colon: Secondary | ICD-10-CM | POA: Diagnosis present

## 2020-08-05 DIAGNOSIS — Z90710 Acquired absence of both cervix and uterus: Secondary | ICD-10-CM

## 2020-08-05 DIAGNOSIS — K562 Volvulus: Secondary | ICD-10-CM | POA: Diagnosis present

## 2020-08-05 DIAGNOSIS — E039 Hypothyroidism, unspecified: Secondary | ICD-10-CM | POA: Diagnosis present

## 2020-08-05 DIAGNOSIS — D62 Acute posthemorrhagic anemia: Secondary | ICD-10-CM | POA: Diagnosis not present

## 2020-08-05 DIAGNOSIS — K660 Peritoneal adhesions (postprocedural) (postinfection): Secondary | ICD-10-CM | POA: Diagnosis present

## 2020-08-05 DIAGNOSIS — K561 Intussusception: Secondary | ICD-10-CM

## 2020-08-05 DIAGNOSIS — Z79890 Hormone replacement therapy (postmenopausal): Secondary | ICD-10-CM

## 2020-08-05 DIAGNOSIS — E278 Other specified disorders of adrenal gland: Secondary | ICD-10-CM | POA: Diagnosis present

## 2020-08-05 DIAGNOSIS — Z79899 Other long term (current) drug therapy: Secondary | ICD-10-CM

## 2020-08-05 DIAGNOSIS — K559 Vascular disorder of intestine, unspecified: Secondary | ICD-10-CM | POA: Diagnosis present

## 2020-08-05 DIAGNOSIS — K458 Other specified abdominal hernia without obstruction or gangrene: Secondary | ICD-10-CM

## 2020-08-05 HISTORY — PX: LAPAROTOMY: SHX154

## 2020-08-05 LAB — CBC
HCT: 40.3 % (ref 36.0–46.0)
Hemoglobin: 12.7 g/dL (ref 12.0–15.0)
MCH: 29.1 pg (ref 26.0–34.0)
MCHC: 31.5 g/dL (ref 30.0–36.0)
MCV: 92.2 fL (ref 80.0–100.0)
Platelets: 413 10*3/uL — ABNORMAL HIGH (ref 150–400)
RBC: 4.37 MIL/uL (ref 3.87–5.11)
RDW: 12.2 % (ref 11.5–15.5)
WBC: 8 10*3/uL (ref 4.0–10.5)
nRBC: 0 % (ref 0.0–0.2)

## 2020-08-05 LAB — RESPIRATORY PANEL BY RT PCR (FLU A&B, COVID)
Influenza A by PCR: NEGATIVE
Influenza B by PCR: NEGATIVE
SARS Coronavirus 2 by RT PCR: POSITIVE — AB

## 2020-08-05 LAB — TYPE AND SCREEN
ABO/RH(D): A POS
Antibody Screen: NEGATIVE

## 2020-08-05 LAB — COMPREHENSIVE METABOLIC PANEL
ALT: 20 U/L (ref 0–44)
AST: 28 U/L (ref 15–41)
Albumin: 3.4 g/dL — ABNORMAL LOW (ref 3.5–5.0)
Alkaline Phosphatase: 112 U/L (ref 38–126)
Anion gap: 9 (ref 5–15)
BUN: 12 mg/dL (ref 6–20)
CO2: 24 mmol/L (ref 22–32)
Calcium: 9.3 mg/dL (ref 8.9–10.3)
Chloride: 106 mmol/L (ref 98–111)
Creatinine, Ser: 0.64 mg/dL (ref 0.44–1.00)
GFR, Estimated: >60 mL/min (ref >60)
Glucose, Bld: 141 mg/dL — ABNORMAL HIGH (ref 70–99)
Potassium: 3.8 mmol/L (ref 3.5–5.1)
Sodium: 139 mmol/L (ref 135–145)
Total Bilirubin: 1.1 mg/dL (ref 0.3–1.2)
Total Protein: 6.5 g/dL (ref 6.5–8.1)

## 2020-08-05 LAB — LIPASE, BLOOD: Lipase: 36 U/L (ref 11–51)

## 2020-08-05 LAB — ABO/RH: ABO/RH(D): A POS

## 2020-08-05 LAB — MRSA PCR SCREENING: MRSA by PCR: NEGATIVE

## 2020-08-05 SURGERY — LAPAROTOMY, EXPLORATORY
Anesthesia: General

## 2020-08-05 MED ORDER — MIDAZOLAM HCL 2 MG/2ML IJ SOLN
INTRAMUSCULAR | Status: AC
  Filled 2020-08-05: qty 2

## 2020-08-05 MED ORDER — FENTANYL CITRATE (PF) 100 MCG/2ML IJ SOLN: 25.0000 ug | INTRAMUSCULAR | Status: DC | PRN

## 2020-08-05 MED ORDER — OXYCODONE HCL 5 MG/5ML PO SOLN: 5.0000 mg | Freq: Once | ORAL | Status: DC | PRN

## 2020-08-05 MED ORDER — DEXAMETHASONE SODIUM PHOSPHATE 10 MG/ML IJ SOLN
INTRAMUSCULAR | Status: DC | PRN
  Administered 2020-08-05: 10 mg via INTRAVENOUS

## 2020-08-05 MED ORDER — OXYCODONE HCL 5 MG PO TABS: 5.0000 mg | ORAL_TABLET | Freq: Once | ORAL | Status: DC | PRN

## 2020-08-05 MED ORDER — KETOROLAC TROMETHAMINE 30 MG/ML IJ SOLN
30.0000 mg | Freq: Four times a day (QID) | INTRAMUSCULAR | Status: AC
  Administered 2020-08-05: 30 mg via INTRAVENOUS
  Filled 2020-08-05: qty 1

## 2020-08-05 MED ORDER — ACETAMINOPHEN 500 MG PO TABS: 1000.0000 mg | ORAL_TABLET | Freq: Four times a day (QID) | ORAL | Status: DC | PRN

## 2020-08-05 MED ORDER — FENTANYL CITRATE (PF) 100 MCG/2ML IJ SOLN
INTRAMUSCULAR | Status: AC
  Filled 2020-08-05: qty 2

## 2020-08-05 MED ORDER — FENTANYL CITRATE (PF) 100 MCG/2ML IJ SOLN
25.0000 ug | INTRAMUSCULAR | Status: DC | PRN
  Administered 2020-08-05: 50 ug via INTRAVENOUS

## 2020-08-05 MED ORDER — ENOXAPARIN SODIUM 40 MG/0.4ML ~~LOC~~ SOLN
40.0000 mg | SUBCUTANEOUS | Status: DC
  Administered 2020-08-06 – 2020-08-11 (×6): 40 mg via SUBCUTANEOUS
  Filled 2020-08-05 (×6): qty 0.4

## 2020-08-05 MED ORDER — LACTATED RINGERS IV SOLN: INTRAVENOUS | Status: DC | PRN

## 2020-08-05 MED ORDER — SODIUM CHLORIDE 0.9 % IV SOLN
INTRAVENOUS | Status: AC
  Filled 2020-08-05: qty 2

## 2020-08-05 MED ORDER — ONDANSETRON HCL 4 MG/2ML IJ SOLN: 4.0000 mg | INTRAMUSCULAR | Status: DC | PRN

## 2020-08-05 MED ORDER — ONDANSETRON 4 MG PO TBDP
4.0000 mg | ORAL_TABLET | Freq: Four times a day (QID) | ORAL | Status: DC | PRN
  Administered 2020-08-10 – 2020-08-11 (×2): 4 mg via ORAL
  Filled 2020-08-05 (×2): qty 1

## 2020-08-05 MED ORDER — CEFAZOLIN SODIUM-DEXTROSE 2-4 GM/100ML-% IV SOLN
2.0000 g | INTRAVENOUS | Status: AC
  Filled 2020-08-05: qty 100

## 2020-08-05 MED ORDER — SACCHAROMYCES BOULARDII 250 MG PO CAPS
250.0000 mg | ORAL_CAPSULE | Freq: Two times a day (BID) | ORAL | Status: DC
  Administered 2020-08-05 – 2020-08-11 (×12): 250 mg via ORAL
  Filled 2020-08-05 (×12): qty 1

## 2020-08-05 MED ORDER — LEVOTHYROXINE SODIUM 25 MCG PO TABS
125.0000 ug | ORAL_TABLET | Freq: Every day | ORAL | Status: DC
  Administered 2020-08-06 – 2020-08-11 (×6): 125 ug via ORAL
  Filled 2020-08-05 (×6): qty 1

## 2020-08-05 MED ORDER — SUCCINYLCHOLINE CHLORIDE 200 MG/10ML IV SOSY
PREFILLED_SYRINGE | INTRAVENOUS | Status: DC | PRN
  Administered 2020-08-05: 120 mg via INTRAVENOUS

## 2020-08-05 MED ORDER — IOHEXOL 300 MG/ML  SOLN
100.0000 mL | Freq: Once | INTRAMUSCULAR | Status: AC | PRN
  Administered 2020-08-05: 100 mL via INTRAVENOUS

## 2020-08-05 MED ORDER — SODIUM CHLORIDE 0.9 % IV BOLUS
500.0000 mL | Freq: Once | INTRAVENOUS | Status: AC
  Administered 2020-08-05: 500 mL via INTRAVENOUS

## 2020-08-05 MED ORDER — ONDANSETRON HCL 4 MG/2ML IJ SOLN: 4.0000 mg | Freq: Once | INTRAMUSCULAR | Status: DC | PRN

## 2020-08-05 MED ORDER — KETOROLAC TROMETHAMINE 30 MG/ML IJ SOLN
30.0000 mg | Freq: Four times a day (QID) | INTRAMUSCULAR | Status: AC | PRN
  Filled 2020-08-05: qty 1

## 2020-08-05 MED ORDER — HYDROMORPHONE HCL 1 MG/ML IJ SOLN
1.0000 mg | INTRAMUSCULAR | Status: DC | PRN
  Administered 2020-08-05 – 2020-08-06 (×2): 1 mg via INTRAVENOUS
  Filled 2020-08-05 (×2): qty 1

## 2020-08-05 MED ORDER — FENTANYL CITRATE (PF) 250 MCG/5ML IJ SOLN
INTRAMUSCULAR | Status: DC | PRN
  Administered 2020-08-05 (×3): 50 ug via INTRAVENOUS

## 2020-08-05 MED ORDER — SUGAMMADEX SODIUM 200 MG/2ML IV SOLN
INTRAVENOUS | Status: DC | PRN
  Administered 2020-08-05: 250 mg via INTRAVENOUS

## 2020-08-05 MED ORDER — ONDANSETRON HCL 4 MG/2ML IJ SOLN
4.0000 mg | Freq: Four times a day (QID) | INTRAMUSCULAR | Status: DC | PRN
  Administered 2020-08-05 – 2020-08-09 (×7): 4 mg via INTRAVENOUS
  Filled 2020-08-05 (×7): qty 2

## 2020-08-05 MED ORDER — GABAPENTIN 300 MG PO CAPS
300.0000 mg | ORAL_CAPSULE | Freq: Two times a day (BID) | ORAL | Status: DC
  Administered 2020-08-05 – 2020-08-11 (×12): 300 mg via ORAL
  Filled 2020-08-05 (×12): qty 1

## 2020-08-05 MED ORDER — SODIUM CHLORIDE 0.9 % IV SOLN
2.0000 g | Freq: Two times a day (BID) | INTRAVENOUS | Status: AC
  Administered 2020-08-05: 2 g via INTRAVENOUS
  Filled 2020-08-05: qty 2

## 2020-08-05 MED ORDER — PHENYLEPHRINE HCL-NACL 10-0.9 MG/250ML-% IV SOLN
INTRAVENOUS | Status: DC | PRN
  Administered 2020-08-05: 15 ug/min via INTRAVENOUS

## 2020-08-05 MED ORDER — CHLORHEXIDINE GLUCONATE CLOTH 2 % EX PADS
6.0000 | MEDICATED_PAD | Freq: Every day | CUTANEOUS | Status: DC
  Administered 2020-08-06 – 2020-08-11 (×6): 6 via TOPICAL

## 2020-08-05 MED ORDER — ACETAMINOPHEN 10 MG/ML IV SOLN
INTRAVENOUS | Status: AC
  Filled 2020-08-05: qty 100

## 2020-08-05 MED ORDER — 0.9 % SODIUM CHLORIDE (POUR BTL) OPTIME
TOPICAL | Status: DC | PRN
  Administered 2020-08-05: 3000 mL

## 2020-08-05 MED ORDER — ENSURE SURGERY PO LIQD
237.0000 mL | Freq: Two times a day (BID) | ORAL | Status: DC
  Administered 2020-08-06 – 2020-08-11 (×10): 237 mL via ORAL
  Filled 2020-08-05 (×15): qty 237

## 2020-08-05 MED ORDER — MORPHINE SULFATE (PF) 4 MG/ML IV SOLN
4.0000 mg | Freq: Once | INTRAVENOUS | Status: AC
  Administered 2020-08-05: 4 mg via INTRAVENOUS
  Filled 2020-08-05: qty 1

## 2020-08-05 MED ORDER — ROCURONIUM BROMIDE 10 MG/ML (PF) SYRINGE
PREFILLED_SYRINGE | INTRAVENOUS | Status: DC | PRN
  Administered 2020-08-05: 50 mg via INTRAVENOUS

## 2020-08-05 MED ORDER — FENTANYL CITRATE (PF) 250 MCG/5ML IJ SOLN
INTRAMUSCULAR | Status: AC
  Filled 2020-08-05: qty 5

## 2020-08-05 MED ORDER — LIDOCAINE 2% (20 MG/ML) 5 ML SYRINGE
INTRAMUSCULAR | Status: DC | PRN
  Administered 2020-08-05: 60 mg via INTRAVENOUS

## 2020-08-05 MED ORDER — ACETAMINOPHEN 10 MG/ML IV SOLN
INTRAVENOUS | Status: DC | PRN
  Administered 2020-08-05: 1000 mg via INTRAVENOUS

## 2020-08-05 MED ORDER — ONDANSETRON HCL 4 MG/2ML IJ SOLN
INTRAMUSCULAR | Status: DC | PRN
  Administered 2020-08-05: 4 mg via INTRAVENOUS

## 2020-08-05 MED ORDER — MORPHINE SULFATE (PF) 2 MG/ML IV SOLN: 1.0000 mg | INTRAVENOUS | Status: DC | PRN

## 2020-08-05 MED ORDER — PROPOFOL 10 MG/ML IV BOLUS
INTRAVENOUS | Status: AC
  Filled 2020-08-05: qty 20

## 2020-08-05 MED ORDER — PROPOFOL 10 MG/ML IV BOLUS
INTRAVENOUS | Status: DC | PRN
  Administered 2020-08-05: 140 mg via INTRAVENOUS

## 2020-08-05 MED ORDER — DEXTROSE-NACL 5-0.9 % IV SOLN: INTRAVENOUS | Status: DC

## 2020-08-05 MED ORDER — BUPIVACAINE HCL (PF) 0.25 % IJ SOLN
INTRAMUSCULAR | Status: AC
  Filled 2020-08-05: qty 30

## 2020-08-05 MED ORDER — ENOXAPARIN SODIUM 40 MG/0.4ML ~~LOC~~ SOLN: 40.0000 mg | SUBCUTANEOUS | Status: DC

## 2020-08-05 MED ORDER — MIDAZOLAM HCL 2 MG/2ML IJ SOLN
INTRAMUSCULAR | Status: DC | PRN
  Administered 2020-08-05: 2 mg via INTRAVENOUS

## 2020-08-05 MED ORDER — PROMETHAZINE HCL 25 MG/ML IJ SOLN: 6.2500 mg | INTRAMUSCULAR | Status: DC | PRN

## 2020-08-05 MED ORDER — SODIUM CHLORIDE 0.9 % IV SOLN
INTRAVENOUS | Status: DC | PRN
  Administered 2020-08-05: 2 g via INTRAVENOUS

## 2020-08-05 SURGICAL SUPPLY — 44 items
APL PRP STRL LF DISP 70% ISPRP (MISCELLANEOUS) ×1
BLADE CLIPPER SURG (BLADE) IMPLANT
BNDG GAUZE ELAST 4 BULKY (GAUZE/BANDAGES/DRESSINGS) ×4 IMPLANT
CANISTER SUCT 3000ML PPV (MISCELLANEOUS) ×3 IMPLANT
CHLORAPREP W/TINT 26 (MISCELLANEOUS) ×3 IMPLANT
COVER SURGICAL LIGHT HANDLE (MISCELLANEOUS) ×3 IMPLANT
DRAPE LAPAROSCOPIC ABDOMINAL (DRAPES) ×3 IMPLANT
DRAPE WARM FLUID 44X44 (DRAPES) ×3 IMPLANT
DRSG OPSITE POSTOP 4X10 (GAUZE/BANDAGES/DRESSINGS) IMPLANT
DRSG OPSITE POSTOP 4X8 (GAUZE/BANDAGES/DRESSINGS) IMPLANT
DRSG PAD ABDOMINAL 8X10 ST (GAUZE/BANDAGES/DRESSINGS) ×2 IMPLANT
ELECT BLADE 6.5 EXT (BLADE) IMPLANT
ELECT CAUTERY BLADE 6.4 (BLADE) ×3 IMPLANT
ELECT REM PT RETURN 9FT ADLT (ELECTROSURGICAL) ×3
ELECTRODE REM PT RTRN 9FT ADLT (ELECTROSURGICAL) ×1 IMPLANT
GLOVE BIO SURGEON STRL SZ7.5 (GLOVE) ×3 IMPLANT
GLOVE BIOGEL PI IND STRL 8 (GLOVE) ×1 IMPLANT
GLOVE BIOGEL PI INDICATOR 8 (GLOVE) ×2
GOWN STRL REUS W/ TWL LRG LVL3 (GOWN DISPOSABLE) ×1 IMPLANT
GOWN STRL REUS W/ TWL XL LVL3 (GOWN DISPOSABLE) ×1 IMPLANT
GOWN STRL REUS W/TWL LRG LVL3 (GOWN DISPOSABLE) ×3
GOWN STRL REUS W/TWL XL LVL3 (GOWN DISPOSABLE) ×3
HANDLE SUCTION POOLE (INSTRUMENTS) ×1 IMPLANT
KIT BASIN OR (CUSTOM PROCEDURE TRAY) ×3 IMPLANT
KIT TURNOVER KIT B (KITS) ×3 IMPLANT
LIGASURE IMPACT 36 18CM CVD LR (INSTRUMENTS) ×2 IMPLANT
NS IRRIG 1000ML POUR BTL (IV SOLUTION) ×6 IMPLANT
PACK GENERAL/GYN (CUSTOM PROCEDURE TRAY) ×3 IMPLANT
PAD ARMBOARD 7.5X6 YLW CONV (MISCELLANEOUS) ×3 IMPLANT
PENCIL SMOKE EVACUATOR (MISCELLANEOUS) ×3 IMPLANT
RELOAD PROXIMATE 75MM BLUE (ENDOMECHANICALS) ×6 IMPLANT
SPECIMEN JAR LARGE (MISCELLANEOUS) IMPLANT
SPONGE LAP 18X18 RF (DISPOSABLE) IMPLANT
STAPLER PROXIMATE 75MM BLUE (STAPLE) ×2 IMPLANT
STAPLER VISISTAT 35W (STAPLE) ×3 IMPLANT
SUCTION POOLE HANDLE (INSTRUMENTS) ×3
SUT PDS AB 1 TP1 54 (SUTURE) ×6 IMPLANT
SUT SILK 2 0 SH CR/8 (SUTURE) ×3 IMPLANT
SUT SILK 2 0 TIES 10X30 (SUTURE) ×3 IMPLANT
SUT SILK 3 0 SH CR/8 (SUTURE) ×3 IMPLANT
SUT SILK 3 0 TIES 10X30 (SUTURE) ×3 IMPLANT
TOWEL GREEN STERILE (TOWEL DISPOSABLE) ×3 IMPLANT
TRAY FOLEY MTR SLVR 16FR STAT (SET/KITS/TRAYS/PACK) ×3 IMPLANT
YANKAUER SUCT BULB TIP NO VENT (SUCTIONS) IMPLANT

## 2020-08-05 NOTE — ED Triage Notes (Signed)
Pt arrives via EMS with RLQ pain for 2 days that has worsened. Last BM yesterday. EMS gave 100 mcg fentanyl and 4 zofran. CBG 204. Pt still nauseous.

## 2020-08-05 NOTE — Transfer of Care (Signed)
Immediate Anesthesia Transfer of Care Note  Patient: Stacey Mcdaniel  Procedure(s) Performed: EXPLORATORY LAPAROTOMY POSSIBLE SMALL BOWEL RESECTION/POSSIBLE COLECTOMY (N/A )  Patient Location: OR recover  Anesthesia Type:General  Level of Consciousness: drowsy  Airway & Oxygen Therapy: Patient Spontanous Breathing  Post-op Assessment: Report given to RN and Post -op Vital signs reviewed and stable  Post vital signs: Reviewed and stable  Last Vitals:  Vitals Value Taken Time  BP    Temp    Pulse    Resp    SpO2      Last Pain:  Vitals:   08/05/20 1600  TempSrc: Oral  PainSc:          Complications: No complications documented.

## 2020-08-05 NOTE — ED Triage Notes (Signed)
Emergency Medicine Provider Triage Evaluation Note  Stacey Mcdaniel , a 54 y.o. female  was evaluated in triage.  Pt complains of right lower quadrant abdominal pain.  Onset 2 days ago gradually worsening associated nausea without vomiting.  Normal bowel movement yesterday.  Denies similar pain in the past.  Denies prior appendectomy.  Has history of hysterectomy.  Review of Systems  Positive: Abdominal pain Negative: Fever chills chest pain shortness of breath vomiting vaginal bleeding vaginal discharge dysuria hematuria cough  Physical Exam  BP (!) 167/109   Pulse 94   Temp 98.4 F (36.9 C) (Oral)   Resp 18   Ht 5\' 5"  (1.651 m)   Wt 64 kg   SpO2 97%   BMI 23.46 kg/m  Gen:   Awake, no distress  HEENT:  Atraumatic normocephalic Resp:  Normal effort no increased work of breathing Abd:   Nondistended, right lower quadrant tenderness, guarding MSK:   Moves extremities without difficulty  Neuro:  Speech clear cranial nerves grossly intact  Medical Decision Making  Medically screening exam initiated at 11:06 AM.  Appropriate orders placed.  Stacey Mcdaniel was informed that the remainder of the evaluation will be completed by another provider, this initial triage assessment does not replace that evaluation, and the importance of remaining in the ED until their evaluation is complete.  Clinical Impression  I was asked to see this patient by nursing staff in triage.  On exam she is tender in the right lower quadrant concern for appendicitis among other acute intra-abdominal processes.  CT ordered from triage along with abdominal pain lab work.  Nursing staff aware patient needs room soon as possible.  Discussed case with Dr. .   Note: Portions of this report may have been transcribed using voice recognition software. Every effort was made to ensure accuracy; however, inadvertent computerized transcription errors may still be present.   Criss Alvine, PA-C 08/05/20 1108

## 2020-08-05 NOTE — ED Notes (Signed)
Stacey Mcdaniel (Niece#[phone]) called.

## 2020-08-05 NOTE — Progress Notes (Signed)
wasted1 cc fentanyl ith the OR RN Melanie .

## 2020-08-05 NOTE — Op Note (Signed)
08/05/2020  5:54 PM  PATIENT:  Stacey Mcdaniel  54 y.o. female  PRE-OPERATIVE DIAGNOSIS:  cecal volvulus  POST-OPERATIVE DIAGNOSIS:  cecal volvulus  PROCEDURE:  Procedure(s): Exploratory laparotomy, right hemicolectomy with anastomosis  SURGEON:  Surgeon(s) and Role:    Axel Filler, MD - Primary  ASSISTANTS: Dr. Marcille Blanco- who was essential in helping with retraction, and removal of the right colon.   ANESTHESIA:   general  EBL:  minimal   BLOOD ADMINISTERED:none  DRAINS: none   LOCAL MEDICATIONS USED:  NONE  SPECIMEN:  Source of Specimen:  Right colon and terminal ileum  DISPOSITION OF SPECIMEN:  PATHOLOGY  COUNTS:  YES  TOURNIQUET:  * No tourniquets in log *  DICTATION: .Dragon Dictation Indication procedure: Patient is a 54 year old female, with 2-day history of abdominal pain.  Patient was seen in the ER and underwent CT scan.  CT scan was significant for cecal volvulus.  Patient was taken back to the operating room emergently for exploratory laparotomy.  Findings: Patient had a large right cecal volvulus.  Patient had her right colon and terminal ileum excised.  Patient underwent anastomosis just proximal to the right colic artery.  Details procedure: After the patient was consented she was taken back to the OR and placed in the supine position with bilateral SCDs in place.  She underwent general trach intubation.  Patient was then prepped and draped in standard fashion.  A timeout was called all facts verified.  A midline incision was made with #10 blade.  Dissection was taken down to the midline fascia.  Cautery was used to maintain hemostasis.  The peritoneum was entered bluntly.  The incision was extended to the length of skin incision.  At this time there was some thin adhesions of the omentum to the right upper quadrant.  These were incised with both sharp and cautery dissection.  At this time the right colon cecal polyp was easily visualized.   This is brought up to the operative field.  At this time this was circumferentially dissected away from surrounding tissue.  This was devolvulized.  At this time the white line of Toldt was incised.  I took dissection up towards hepatic flexure.  This was able to medialize the right colon and the proximal transverse colon.  At this time the mesentery of the terminal ileum was incised with cautery as was an area just proximal to the right colic artery.  A 75 GIA stapler was then used to transect the proximal and distal portions of the specimen.  This was sent off to pathology.  At this time the corner of the staple line was excised on both the small intestine and large intestine.  75 GIA stapler was used to create the anastomosis.  Staple lines were offset and the anastomosis was closed using a another firing of 75 GIA stapler.  An apex stitch was placed at the staple line with a 3-0 silk.  At this time the mesentery was reapproximated using figure-of-eight 3 oh silks x3.  The abdominal cavity was irrigated out with sterile saline.  Minimal omentum was brought over the midline fascia area.  The midline fascia was reapproximated using #1 PDS in a running standard fashion.  The skin was left open and irrigated out with sterile saline.  This was packed with saline soaked Kerlix ABD pad and tape.  Patient tolerated the procedure well was taken to the recovery in stable condition.    PLAN OF CARE: Admit to inpatient  PATIENT DISPOSITION:  PACU - hemodynamically stable.   Delay start of Pharmacological VTE agent (>24hrs) due to surgical blood loss or risk of bleeding: yes

## 2020-08-05 NOTE — ED Notes (Signed)
This RN witnessed physician discussing procedure with pt & consent was signed while in his presence.

## 2020-08-05 NOTE — Anesthesia Procedure Notes (Signed)
Procedure Name: Intubation Date/Time: 08/05/2020 5:00 PM Performed by: Dairl Ponder, CRNA Pre-anesthesia Checklist: Patient identified, Emergency Drugs available, Suction available, Patient being monitored and Timeout performed Patient Re-evaluated:Patient Re-evaluated prior to induction Oxygen Delivery Method: Circle system utilized Preoxygenation: Pre-oxygenation with 100% oxygen Induction Type: Rapid sequence and Cricoid Pressure applied Ventilation: Mask ventilation without difficulty Laryngoscope Size: Glidescope and 3 (covid positive) Grade View: Grade I Tube type: Oral Tube size: 7.0 mm Number of attempts: 1 Airway Equipment and Method: Stylet Placement Confirmation: ETT inserted through vocal cords under direct vision,  positive ETCO2 and breath sounds checked- equal and bilateral Secured at: 21 cm Tube secured with: Tape Dental Injury: Teeth and Oropharynx as per pre-operative assessment

## 2020-08-05 NOTE — ED Notes (Signed)
Stacey Mcdaniel [phone]) called/would like a call once patient comes out of surgery.  Thank you

## 2020-08-05 NOTE — Progress Notes (Signed)
Arrived to the floor from ED, alert and oriented, no signs of distress, ambulated to the bathroom , chg bath done. mrsa screening sent to lab. OR to pick up patient for surgery, OR aware of the covid positive result.

## 2020-08-05 NOTE — Progress Notes (Signed)
Called and updated pts friend Corrie Dandy per patients request. Let her know that the pt had surgery and is now recovering on 5W.

## 2020-08-05 NOTE — Anesthesia Preprocedure Evaluation (Addendum)
Anesthesia Evaluation  Patient identified by MRN, date of birth, ID band Patient awake    Reviewed: Allergy & Precautions, NPO status , Patient's Chart, lab work & pertinent test results  History of Anesthesia Complications Negative for: history of anesthetic complications  Airway Mallampati: II  TM Distance: >3 FB Neck ROM: Full    Dental  (+) Teeth Intact   Pulmonary neg pulmonary ROS,    breath sounds clear to auscultation       Cardiovascular negative cardio ROS   Rhythm:Regular Rate:Normal     Neuro/Psych negative neurological ROS  negative psych ROS   GI/Hepatic Neg liver ROS,  S/p gastric bypass Cecal volvulus    Endo/Other  Hypothyroidism   Renal/GU negative Renal ROS     Musculoskeletal negative musculoskeletal ROS (+)   Abdominal   Peds  Hematology negative hematology ROS (+)   Anesthesia Other Findings Covid test pending   Reproductive/Obstetrics  s/p hysterectomy                            Anesthesia Physical Anesthesia Plan  ASA: II and emergent  Anesthesia Plan: General   Post-op Pain Management:    Induction: Intravenous and Rapid sequence  PONV Risk Score and Plan: 4 or greater and Treatment may vary due to age or medical condition, Ondansetron, Midazolam, Scopolamine patch - Pre-op and Dexamethasone  Airway Management Planned: Oral ETT  Additional Equipment: None  Intra-op Plan:   Post-operative Plan: Extubation in OR  Informed Consent: I have reviewed the patients History and Physical, chart, labs and discussed the procedure including the risks, benefits and alternatives for the proposed anesthesia with the patient or authorized representative who has indicated his/her understanding and acceptance.       Plan Discussed with: CRNA and Anesthesiologist  Anesthesia Plan Comments:        Anesthesia Quick Evaluation

## 2020-08-05 NOTE — Progress Notes (Signed)
Received a call from the lab with positive covid test. MD made aware. Will transfer patient to covid floor.

## 2020-08-05 NOTE — ED Provider Notes (Signed)
[facility] EMERGENCY DEPARTMENT Provider Note   CSN: [id number] Arrival date & time: 08/05/20  1042     History Chief Complaint  Patient presents with  . Abdominal Pain    Stacey Mcdaniel is a 54 y.o. female history includes gastric bypass, hysterectomy, cholecystectomy.  Patient presented today with right lower quadrant pain onset 2 days ago gradually worsening pain sharp nonradiating no aggravating or alleviating factors.  Denies similar pain in the past.  Associated nausea without vomiting.  Normal bowel movement yesterday without blood or melena.  Denies fever/chills, headache, fall/injury, chest pain/shortness of breath, vomiting, diarrhea, dysuria/hematuria, vaginal bleeding/discharge or any additional concerns.  HPI     Past Medical History:  Diagnosis Date  . Allergy   . Thyroid disease     Patient Active Problem List   Diagnosis Date Noted  . Nondisplaced fracture of fifth left metatarsal bone with routine healing 09/25/2017    Past Surgical History:  Procedure Laterality Date  . ABDOMINAL HYSTERECTOMY    . CHOLECYSTECTOMY    . GASTRIC BYPASS       OB History   No obstetric history on file.     No family history on file.  Social History   Tobacco Use  . Smoking status: Never Smoker  . Smokeless tobacco: Never Used  Substance Use Topics  . Alcohol use: No  . Drug use: No    Home Medications Prior to Admission medications   Medication Sig Start Date End Date Taking? Authorizing Provider  buPROPion (WELLBUTRIN XL) 150 MG 24 hr tablet  12/20/18   [provider]  dexmethylphenidate (FOCALIN XR) 20 MG 24 hr capsule  11/21/19   [provider]  diclofenac Sodium (VOLTAREN) 1 % GEL Apply 4 g topically 4 (four) times daily. 08/22/19   Stover, Titorya, DPM  KLS OMPERAZOLE 20 MG TBEC Take 1 tablet by mouth daily. 11/04/19   [provider]  levothyroxine (SYNTHROID, LEVOTHROID) 125 MCG tablet Take by mouth.     [provider]  valACYclovir (VALTREX) 1000 MG tablet  11/17/19   [provider]    Allergies    Patient has no known allergies.  Review of Systems   Review of Systems Ten systems are reviewed and are negative for acute change except as noted in the HPI  Physical Exam Updated Vital Signs BP (!) 133/113   Pulse 82   Temp 98.4 F (36.9 C) (Oral)   Resp 18   Ht 5\' 5"  (1.651 m)   Wt 64 kg   SpO2 99%   BMI 23.46 kg/m   Physical Exam Constitutional:      General: She is not in acute distress.    Appearance: Normal appearance. She is well-developed. She is not ill-appearing or diaphoretic.  HENT:     Head: Normocephalic and atraumatic.  Eyes:     General: Vision grossly intact. Gaze aligned appropriately.     Pupils: Pupils are equal, round, and reactive to light.  Neck:     Trachea: Trachea and phonation normal.  Pulmonary:     Effort: Pulmonary effort is normal. No respiratory distress.  Abdominal:     General: There is no distension.     Palpations: Abdomen is soft.     Tenderness: There is abdominal tenderness in the right lower quadrant. There is guarding. There is no rebound.  Musculoskeletal:        General: Normal range of motion.     Cervical back: Normal range  of motion.  Skin:    General: Skin is warm and dry.  Neurological:     Mental Status: She is alert.     GCS: GCS eye subscore is 4. GCS verbal subscore is 5. GCS motor subscore is 6.     Comments: Speech is clear and goal oriented, follows commands Major Cranial nerves without deficit, no facial droop Moves extremities without ataxia, coordination intact  Psychiatric:        Behavior: Behavior normal.    ED Results / Procedures / Treatments   Labs (all labs ordered are listed, but only abnormal results are displayed) Labs Reviewed  COMPREHENSIVE METABOLIC PANEL - Abnormal; Notable for the following components:      Result Value   Glucose, Bld 141 (*)    Albumin 3.4 (*)    All  other components within normal limits  CBC - Abnormal; Notable for the following components:   Platelets 413 (*)    All other components within normal limits  RESPIRATORY PANEL BY RT PCR (FLU A&B, COVID)  LIPASE, BLOOD  URINALYSIS, ROUTINE W REFLEX MICROSCOPIC    EKG None  Radiology CT ABDOMEN PELVIS W CONTRAST  Result Date: 08/05/2020 CLINICAL DATA:  Right lower abdominal pain EXAM: CT ABDOMEN AND PELVIS WITH CONTRAST TECHNIQUE: Multidetector CT imaging of the abdomen and pelvis was performed using the standard protocol following bolus administration of intravenous contrast. CONTRAST:  OMNIPAQUE IOHEXOL 300 MG/ML  SOLN COMPARISON:  None. FINDINGS: Lower chest: Areas of patchy airspace opacity are noted in the right lower lobe consistent with pneumonia. There is a focal hiatal hernia. Hepatobiliary: There is mild fatty infiltration near the fissure for the ligamentum teres. No focal liver lesions are evident. The gallbladder is absent. There is intrahepatic biliary duct dilatation. There does not appear to be appreciable common hepatic or common bile duct. No obstructing lesion evident in the biliary ductal system. Pancreas: There is no pancreatic mass or inflammatory focus. Spleen: No splenic lesions are evident. Adrenals/Urinary Tract: There is a mass arising from the right adrenal measuring 4.4 x 1.8 cm. There is a left adrenal mass measuring 2.3 x 1.4 cm. Both of these masses have attenuation values placing them in indeterminate category with respect to etiology. Kidneys bilaterally show no mass or hydronephrosis on either side. There is an extrarenal pelvis on the right, an anatomic variant. There is no appreciable renal or ureteral calculus on either side. Urinary bladder is midline with wall thickness within normal limits. Stomach/Bowel: Patient has had previous gastric bypass surgery. There is a somewhat swirled appearance to the mesentery in the left lower abdomen which raises concern  for an internal hernia. No bowel extends into this area of questionable internal hernia. Note that the cecum bends upon itself with the cecum located in the midline with dilatation. The appearance is felt to represent a degree of cecal volvulus. Note somewhat swirled appearance in the area of the cecal volvulus which may represent a second internal hernia. There is mesenteric inflammation/thickening at the level of the terminal ileum. There is no fistula or appreciable bowel wall thickening involving the terminal ileum. The nearby appendix appears unremarkable. In the left mid abdomen, there is a focus of jejunojejunal intussusception extending over a distance of 4.0 cm. This finding is best seen on axial slices 39 through 43 series 3 and coronal slices 82 through 93 series 6. There is no associated bowel wall thickening in this area of intussusception. No well-defined small-bowel obstruction evident. No free  air or portal venous air. Vascular/Lymphatic: No abdominal aortic aneurysm. No arterial vascular lesions evident. Major venous structures appear patent. No adenopathy in the abdomen or pelvis. Reproductive: Uterus absent.  No adnexal masses. Other: No abscess or ascites in the abdomen or pelvis. Musculoskeletal: No blastic or lytic bone lesions. Foci of degenerative change noted in the lumbar spine. No intramuscular or abdominal wall lesions evident. IMPRESSION: 1. 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 No free air or pneumatosis. Nearby mesenteric thickening near the ileocecal junction noted. The terminal ileum itself is not felt to be inflamed. There is a questionable internal hernia in this immediate vicinity with a somewhat swirled appearance of the vessels near the area of cecal volvulus. Nearby appendix appears unremarkable. Surgical consultation advised. 2. Suspected second focus of internal hernia not containing bowel seen in the left upper pelvis, best appreciated on axial slices 58 through 63 series  3. 3. Focus of jejunal-jejunal intussusception in the left mid abdomen with approximately 4 cm of involvement of intussusception in this area. Surgical consultation may be advised given this finding in light of other findings. At a minimum, this finding will warrant CT enterography based on its length. 4.  Foci of apparent pneumonia right lower lobe. 5. Previous gastric bypass procedure. No gastric wall thickening or fluid. Note that gastric bypass procedure increases risk for internal hernia development. 6. Gallbladder absent. Intrahepatic biliary duct dilatation. No appreciable common bile duct dilatation. 7. 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 The larger of these adrenal masses on the right may warrant surgical consultation. Per consensus guidelines, the smaller mass in the left warrants repeat CT in 1 year to further assess. 8.  Fairly small hiatal hernia. Electronically Signed   By: Bretta Bang III M.D.   On: 08/05/2020 13:07    Procedures .Critical Care Performed by: Bill Salinas, PA-C Authorized by: Bill Salinas, PA-C   Critical care provider statement:    Critical care time (minutes):  30   Critical care was time spent personally by me on the following activities:  Discussions with consultants, evaluation of patient's response to treatment, examination of patient, ordering and performing treatments and interventions, ordering and review of laboratory studies, ordering and review of radiographic studies, pulse oximetry, re-evaluation of patient's condition, obtaining history from patient or surrogate, review of old charts and development of treatment plan with patient or surrogate   (including critical care time)  Medications Ordered in ED Medications  iohexol (OMNIPAQUE) 300 MG/ML solution 100 mL (100 mLs Intravenous Contrast Given 08/05/20 1241)  sodium chloride 0.9 % bolus 500 mL  (500 mLs Intravenous New Bag/Given (Non-Interop) 08/05/20 1417)  morphine 4 MG/ML injection 4 mg (4 mg Intravenous Given 08/05/20 1418)    ED Course  I have reviewed the triage vital signs and the nursing notes.  Pertinent labs & imaging results that were available during my care of the patient were reviewed by me and considered in my medical decision making (see chart for details).    MDM Rules/Calculators/A&P                         Additional history obtained from: 1. Nursing notes from this visit. 2. Review of electronic medical records.  No pertinent recent ER visits. -------------- 54 year old female with 2 days of right lower quadrant pain and nausea.  History of gastric bypass, hysterectomy and  cholecystectomy.  I was asked to initially evaluate this patient up in triage, she had tender right lower quadrant and some voluntary guarding.  CT abdomen pelvis was ordered in triage along with basic labs. - I ordered, reviewed and interpreted labs which include: CBC without leukocytosis to suggest infection or anemia. CMP without emergent electrolyte derangement, AKI, LFT elevations or gap.  Lipase within normal limits.   Urinalysis pending.   Covid test pending.  CTAP:  IMPRESSION:  1. 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 No free air or  pneumatosis. Nearby mesenteric thickening near the ileocecal  junction noted. The terminal ileum itself is not felt to be  inflamed. There is a questionable internal hernia in this immediate  vicinity with a somewhat swirled appearance of the vessels near the  area of cecal volvulus. Nearby appendix appears unremarkable.  Surgical consultation advised.    2. Suspected second focus of internal hernia not containing bowel  seen in the left upper pelvis, best appreciated on axial slices 58  through 63 series 3.    3. Focus of jejunal-jejunal intussusception in the left mid abdomen  with approximately 4 cm of involvement of intussusception  in this  area. Surgical consultation may be advised given this finding in  light of other findings. At a minimum, this finding will warrant CT  enterography based on its length.    4. Foci of apparent pneumonia right lower lobe.    5. Previous gastric bypass procedure. No gastric wall thickening or  fluid. Note that gastric bypass procedure increases risk for  internal hernia development.    6. Gallbladder absent. Intrahepatic biliary duct dilatation. No  appreciable common bile duct dilatation.    7. 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 The  larger of these adrenal masses on the right may warrant surgical  consultation. Per consensus guidelines, the smaller mass in the left  warrants repeat CT in 1 year to further assess.    8. Fairly small hiatal hernia.  - Discussed case with Dr. Criss AlvineGoldston, will consult gastroenterology and general surgery. - Patient reassessed she is resting comfortably in bed no acute distress.  She is updated on findings above and she is agreeable to specialist consultation and admission.  She has no additional complaints or concerns. - 1:50 PM: Consulted with general surgery physician assistant Maczis who is coming to evaluate patient. - Patient was seen and evaluated by general surgery they advised that they will admit the patient to their service and plan for surgery in the next hour and gastroenterology involvement is not needed.  Will cancel GI consult. - Patient received pain medication as well as fluids.  She was admitted to general surgery service.    Note: Portions of this report may have been transcribed using voice recognition software. Every effort was made to ensure accuracy; however, inadvertent computerized transcription errors may still be present. Final Clinical Impression(s) / ED Diagnoses Final diagnoses:  Volvulus (HCC)  Intussusception  [facility](HCC)  Internal hernia    Rx / DC Orders ED Discharge Orders    None       Elizabeth PalauMorelli, Mateus Rewerts A, PA-C 08/05/20 1425    Pricilla LovelessGoldston, Scott, MD 08/07/20 [phone]

## 2020-08-05 NOTE — H&P (Signed)
Stacey Mcdaniel Aug 24, 1966  [id number].    Requesting MD: Dr. Criss Alvine MD; Jenna Luo, PA-C Chief Complaint/Reason for Consult: Cecal Volvulus  HPI: Stacey Mcdaniel is a 54 y.o. female who presented to [facility] ED with abdominal pain.  Patient reports 2 days ago she had the gradual onset of right lower quadrant abdominal pain, nausea and inability to pass flatus.  She reports her pain gradually worsened and became more severe today.  She tried over-the-counter medications for this without relief.  She denies history of similar symptoms in the past.  Denies fever, chills, emesis, diarrhea or urinary symptoms.  Notes small BM yesterday but no flatus. 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 We were asked to see. Patient has a history of a prior open cholecystectomy, open gastric bypass, abdominal hysterectomy.  She is not on blood thinners.  ROS: Review of Systems  Constitutional: Negative for chills and fever.  Respiratory: Negative for cough and shortness of breath.   Cardiovascular: Negative for chest pain.  Gastrointestinal: Positive for abdominal pain, constipation and nausea. Negative for diarrhea, heartburn and vomiting.  Genitourinary: Negative for dysuria.  Musculoskeletal: Negative for back pain.  Psychiatric/Behavioral: Negative for substance abuse.  All other systems reviewed and are negative.   No family history on file.  Past Medical History:  Diagnosis Date  . Allergy   . Thyroid disease    NKDA  Past Surgical History:  Procedure Laterality Date  . ABDOMINAL HYSTERECTOMY    . CHOLECYSTECTOMY    . GASTRIC BYPASS      Social History:  reports that she has never smoked. She has never used smokeless tobacco. She reports that she does not drink alcohol and does not use drugs. Patient is a Associate Professor at [employer].  Lives alone.  Denies tobacco, alcohol or illicit drug use.  Allergies: No Known Allergies  (Not in a hospital admission)  Physical Exam: Blood pressure  (!) 133/113, pulse 82, temperature 98.4 F (36.9 C), temperature source Oral, resp. rate 18, height 5\' 5"  (1.651 m), weight 64 kg, SpO2 99 %. General: pleasant, WD/WN white female who is laying in bed in NAD HEENT: head is normocephalic, atraumatic.  Sclera are noninjected.  PERRL.  Ears and nose without any masses or lesions.  Mouth is pink and moist. Dentition fair Heart: regular, rate, and rhythm.  Normal s1,s2. No obvious murmurs, gallops, or rubs noted.  Palpable pedal pulses bilaterally  Lungs: CTAB, no wheezes, rhonchi, or rales noted.  Respiratory effort nonlabored Abd: Soft, mild to moderate distension, there is epigastric, RUQ and RLQ tenderness without peritonitis. Hypoactive BS. No masses, hernias, or organomegaly. Prior abdominal scars are well healed.  MS: no BUE/BLE edema, calves soft and nontender Skin: warm and dry with no masses, lesions, or rashes Psych: A&Ox4 with an appropriate affect Neuro: cranial nerves grossly intact, equal strength in BUE/BLE bilaterally, normal speech, thought process intact       Results for orders placed or performed during the hospital encounter of 08/05/20 (from the past 48 hour(s))  Lipase, blood     Status: None   Collection Time: 08/05/20 11:31 AM  Result Value Ref Range   Lipase 36 11 - 51 U/L    Comment: Performed at [facility], [address], [place], [place]  Comprehensive metabolic panel     Status: Abnormal   Collection Time: 08/05/20 11:31 AM  Result Value Ref Range   Sodium 139 135 - 145 mmol/L   Potassium 3.8  3.5 - 5.1 mmol/L   Chloride 106 98 - 111 mmol/L   CO2 24 22 - 32 mmol/L   Glucose, Bld 141 (H) 70 - 99 mg/dL    Comment: Glucose reference range applies only to samples taken after fasting for at least 8 hours.   BUN 12 6 - 20 mg/dL   Creatinine, Ser 1.610.64 0.44 - 1.00 mg/dL   Calcium 9.3 8.9 - 09.610.3 mg/dL   Total Protein 6.5 6.5 - 8.1 g/dL   Albumin 3.4 (L) 3.5 - 5.0 g/dL   AST 28 15 - 41 U/L   ALT  20 0 - 44 U/L   Alkaline Phosphatase 112 38 - 126 U/L   Total Bilirubin 1.1 0.3 - 1.2 mg/dL   GFR, Estimated >04>60 >54>60 mL/min    Comment: (NOTE) Calculated using the CKD-EPI Creatinine Equation (2021)    Anion gap 9 5 - 15    Comment: Performed at [facility], [address] [place], [place] [address]  CBC     Status: Abnormal   Collection Time: 08/05/20 11:31 AM  Result Value Ref Range   WBC 8.0 4.0 - 10.5 K/uL   RBC 4.37 3.87 - 5.11 MIL/uL   Hemoglobin 12.7 12.0 - 15.0 g/dL   HCT 91.440.3 36 - 46 %   MCV 92.2 80.0 - 100.0 fL   MCH 29.1 26.0 - 34.0 pg   MCHC 31.5 30.0 - 36.0 g/dL   RDW 78.212.2 95.611.5 - 21.315.5 %   Platelets 413 (H) 150 - 400 K/uL   nRBC 0.0 0.0 - 0.2 %    Comment: Performed at [facility], [address] [place], [place] [address]   CT ABDOMEN PELVIS W CONTRAST  Result Date: 08/05/2020 CLINICAL DATA:  Right lower abdominal pain EXAM: CT ABDOMEN AND PELVIS WITH CONTRAST TECHNIQUE: Multidetector CT imaging of the abdomen and pelvis was performed using the standard protocol following bolus administration of intravenous contrast. CONTRAST:  100mL OMNIPAQUE IOHEXOL 300 MG/ML  SOLN COMPARISON:  None. FINDINGS: Lower chest: Areas of patchy airspace opacity are noted in the right lower lobe consistent with pneumonia. There is a focal hiatal hernia. Hepatobiliary: There is mild fatty infiltration near the fissure for the ligamentum teres. No focal liver lesions are evident. The gallbladder is absent. There is intrahepatic biliary duct dilatation. There does not appear to be appreciable common hepatic or common bile duct. No obstructing lesion evident in the biliary ductal system. Pancreas: There is no pancreatic mass or inflammatory focus. Spleen: No splenic lesions are evident. Adrenals/Urinary Tract: There is a mass arising from the right adrenal measuring 4.4 x 1.8 cm. There is a left adrenal mass measuring 2.3 x 1.4 cm. Both of these masses have attenuation values placing  them in indeterminate category with respect to etiology. Kidneys bilaterally show no mass or hydronephrosis on either side. There is an extrarenal pelvis on the right, an anatomic variant. There is no appreciable renal or ureteral calculus on either side. Urinary bladder is midline with wall thickness within normal limits. Stomach/Bowel: Patient has had previous gastric bypass surgery. There is a somewhat swirled appearance to the mesentery in the left lower abdomen which raises concern for an internal hernia. No bowel extends into this area of questionable internal hernia. Note that the cecum bends upon itself with the cecum located in the midline with dilatation. The appearance is felt to represent a degree of cecal volvulus. Note somewhat swirled appearance in the area of the cecal  volvulus which may represent a second internal hernia. There is mesenteric inflammation/thickening at the level of the terminal ileum. There is no fistula or appreciable bowel wall thickening involving the terminal ileum. The nearby appendix appears unremarkable. In the left mid abdomen, there is a focus of jejunojejunal intussusception extending over a distance of 4.0 cm. This finding is best seen on axial slices 39 through 43 series 3 and coronal slices 82 through 93 series 6. There is no associated bowel wall thickening in this area of intussusception. No well-defined small-bowel obstruction evident. No free air or portal venous air. Vascular/Lymphatic: No abdominal aortic aneurysm. No arterial vascular lesions evident. Major venous structures appear patent. No adenopathy in the abdomen or pelvis. Reproductive: Uterus absent.  No adnexal masses. Other: No abscess or ascites in the abdomen or pelvis. Musculoskeletal: No blastic or lytic bone lesions. Foci of degenerative change noted in the lumbar spine. No intramuscular or abdominal wall lesions evident. IMPRESSION: 1. 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 No free air or  pneumatosis. Nearby mesenteric thickening near the ileocecal junction noted. The terminal ileum itself is not felt to be inflamed. There is a questionable internal hernia in this immediate vicinity with a somewhat swirled appearance of the vessels near the area of cecal volvulus. Nearby appendix appears unremarkable. Surgical consultation advised. 2. Suspected second focus of internal hernia not containing bowel seen in the left upper pelvis, best appreciated on axial slices 58 through 63 series 3. 3. Focus of jejunal-jejunal intussusception in the left mid abdomen with approximately 4 cm of involvement of intussusception in this area. Surgical consultation may be advised given this finding in light of other findings. At a minimum, this finding will warrant CT enterography based on its length. 4.  Foci of apparent pneumonia right lower lobe. 5. Previous gastric bypass procedure. No gastric wall thickening or fluid. Note that gastric bypass procedure increases risk for internal hernia development. 6. Gallbladder absent. Intrahepatic biliary duct dilatation. No appreciable common bile duct dilatation. 7. 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 The larger of these adrenal masses on the right may warrant surgical consultation. Per consensus guidelines, the smaller mass in the left warrants repeat CT in 1 year to further assess. 8.  Fairly small hiatal hernia. Electronically Signed   By: Bretta Bang III M.D.   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  Patient found to have cecal volvulus without evidence of free air or pneumatosis on CT scan.  There is also a question of an  internal hernia in the left upper pelvis and jejunal-jejunal intussusception.  Will plan for OR today for exploratory laparotomy. The pathophysiology of  was discussed with the patient. The planned procedure, risks including, but not limited to, pain, bleeding, infection, scarring, damage to surrounding structures (blood vessels/viscus/organs, damage to ureter) leak from anastomosis, need for additional procedures and anesthesia risk (pneumonia, heart attack, stroke, death) were discussed at length. The patient's questions were answered to their satisfaction, they voiced understanding and elected to proceed with surgery. Additionally, we discussed typical postoperative expectations and the recovery process.  FEN - NPO, IVF VTE - SCDs ID - Cefotetan periop  Casimiro Needle  Kirstie Mirza, PA-C [facility] Surgery 08/05/2020, 2:21 PM Please see Amion for pager number during day hours 7:00am-4:30pm

## 2020-08-06 ENCOUNTER — Other Ambulatory Visit: Payer: Self-pay

## 2020-08-06 ENCOUNTER — Encounter (HOSPITAL_COMMUNITY): Payer: Self-pay

## 2020-08-06 LAB — BASIC METABOLIC PANEL
Anion gap: 10 (ref 5–15)
BUN: 6 mg/dL (ref 6–20)
CO2: 25 mmol/L (ref 22–32)
Calcium: 8.9 mg/dL (ref 8.9–10.3)
Chloride: 104 mmol/L (ref 98–111)
Creatinine, Ser: 0.73 mg/dL (ref 0.44–1.00)
GFR, Estimated: >60 mL/min (ref >60)
Glucose, Bld: 216 mg/dL — ABNORMAL HIGH (ref 70–99)
Potassium: 4.2 mmol/L (ref 3.5–5.1)
Sodium: 139 mmol/L (ref 135–145)

## 2020-08-06 LAB — CBC
HCT: 38.7 % (ref 36.0–46.0)
Hemoglobin: 12.4 g/dL (ref 12.0–15.0)
MCH: 29 pg (ref 26.0–34.0)
MCHC: 32 g/dL (ref 30.0–36.0)
MCV: 90.4 fL (ref 80.0–100.0)
Platelets: 362 10*3/uL (ref 150–400)
RBC: 4.28 MIL/uL (ref 3.87–5.11)
RDW: 12.3 % (ref 11.5–15.5)
WBC: 11.2 10*3/uL — ABNORMAL HIGH (ref 4.0–10.5)
nRBC: 0 % (ref 0.0–0.2)

## 2020-08-06 MED ORDER — DOCUSATE SODIUM 100 MG PO CAPS
100.0000 mg | ORAL_CAPSULE | Freq: Two times a day (BID) | ORAL | Status: DC
  Administered 2020-08-06 – 2020-08-11 (×11): 100 mg via ORAL
  Filled 2020-08-06 (×11): qty 1

## 2020-08-06 MED ORDER — OXYCODONE HCL 5 MG PO TABS
5.0000 mg | ORAL_TABLET | ORAL | Status: DC | PRN
  Administered 2020-08-06 – 2020-08-07 (×4): 10 mg via ORAL
  Administered 2020-08-07 – 2020-08-11 (×3): 5 mg via ORAL
  Filled 2020-08-06: qty 2
  Filled 2020-08-06 (×2): qty 1
  Filled 2020-08-06 (×4): qty 2

## 2020-08-06 MED ORDER — METHOCARBAMOL 500 MG PO TABS: 500.0000 mg | ORAL_TABLET | Freq: Three times a day (TID) | ORAL | Status: DC | PRN

## 2020-08-06 MED ORDER — ACETAMINOPHEN 500 MG PO TABS
1000.0000 mg | ORAL_TABLET | Freq: Three times a day (TID) | ORAL | Status: DC
  Administered 2020-08-06 – 2020-08-11 (×16): 1000 mg via ORAL
  Filled 2020-08-06 (×16): qty 2

## 2020-08-06 MED ORDER — HYDROMORPHONE HCL 1 MG/ML IJ SOLN
0.5000 mg | INTRAMUSCULAR | Status: DC | PRN
  Filled 2020-08-06: qty 1

## 2020-08-06 MED ORDER — HYDROMORPHONE HCL 1 MG/ML IJ SOLN: 0.5000 mg | INTRAMUSCULAR | Status: DC | PRN

## 2020-08-06 NOTE — Progress Notes (Addendum)
1 Day Post-Op  Subjective: CC: Patient reports soreness around her incision and right abdomen. Has not gotten oob since surgery. Foley out this AM. Has not voided yet. She is tolerating clears without increased abdominal pain, n/v. She notes she does get nauseated after dilaudid. It appears she has required zofran x 2 which is at the same time as dilaudid which is consistent with what she reports. We discussed trying oral medications. No nausea otherwise. She denies flatus or bm.   She denies nasal congestion, lost of taste/smell, sob, cp, cough, or any other symptoms.   Objective: Vital signs in last 24 hours: Temp:  [97.3 F (36.3 C)-98.2 F (36.8 C)] 98.1 F (36.7 C) (11/19 0758) Pulse Rate:  [74-102] 85 (11/19 0758) Resp:  [10-28] 13 (11/19 0758) BP: (122-174)/(67-119) 149/79 (11/19 0758) SpO2:  [95 %-100 %] 97 % (11/19 0758) Weight:  [65.7 kg-67.9 kg] 65.7 kg (11/19 0500) Last BM Date:  (PTA)  Intake/Output from previous day: 11/18 0701 - 11/19 0700 In: 1460.6 [I.V.:1460.6] Out: 3450 [Urine:3400; Blood:50] Intake/Output this shift: No intake/output data recorded.  PE: Gen:  Alert, NAD, pleasant HEENT: EOM's intact, pupils equal and round Card:  RRR Pulm:  CTAB, no W/R/R, effort normal, on RA. 1000 on IS Abd: Soft, ND, appropriately tender around midline incision and right abdomen without peritonitis. Midline wound clean and without signs of dehiscence. Hypoactive bowel sounds.  Ext:  No LE edema  Psych: A&Ox3  Skin: no rashes noted, warm and dry   Lab Results:  Recent Labs    08/05/20 1131 08/06/20 0021  WBC 8.0 11.2*  HGB 12.7 12.4  HCT 40.3 38.7  PLT 413* 362   BMET Recent Labs    08/05/20 1131 08/06/20 0021  NA 139 139  K 3.8 4.2  CL 106 104  CO2 24 25  GLUCOSE 141* 216*  BUN 12 6  CREATININE 0.64 0.73  CALCIUM 9.3 8.9   PT/INR No results for input(s): LABPROT, INR in the last 72 hours. CMP     Component Value Date/Time   NA 139  08/06/2020 0021   K 4.2 08/06/2020 0021   CL 104 08/06/2020 0021   CO2 25 08/06/2020 0021   GLUCOSE 216 (H) 08/06/2020 0021   BUN 6 08/06/2020 0021   CREATININE 0.73 08/06/2020 0021   CALCIUM 8.9 08/06/2020 0021   PROT 6.5 08/05/2020 1131   ALBUMIN 3.4 (L) 08/05/2020 1131   AST 28 08/05/2020 1131   ALT 20 08/05/2020 1131   ALKPHOS 112 08/05/2020 1131   BILITOT 1.1 08/05/2020 1131   GFRNONAA >60 08/06/2020 0021   Lipase     Component Value Date/Time   LIPASE 36 08/05/2020 1131       Studies/Results: CT ABDOMEN PELVIS W CONTRAST  Result Date: 08/05/2020 CLINICAL DATA:  Right lower abdominal pain EXAM: CT ABDOMEN AND PELVIS WITH CONTRAST TECHNIQUE: Multidetector CT imaging of the abdomen and pelvis was performed using the standard protocol following bolus administration of intravenous contrast. CONTRAST:  OMNIPAQUE IOHEXOL 300 MG/ML  SOLN COMPARISON:  None. FINDINGS: Lower chest: Areas of patchy airspace opacity are noted in the right lower lobe consistent with pneumonia. There is a focal hiatal hernia. Hepatobiliary: There is mild fatty infiltration near the fissure for the ligamentum teres. No focal liver lesions are evident. The gallbladder is absent. There is intrahepatic biliary duct dilatation. There does not appear to be appreciable common hepatic or common bile duct. No obstructing lesion evident in the biliary  ductal system. Pancreas: There is no pancreatic mass or inflammatory focus. Spleen: No splenic lesions are evident. Adrenals/Urinary Tract: There is a mass arising from the right adrenal measuring 4.4 x 1.8 cm. There is a left adrenal mass measuring 2.3 x 1.4 cm. Both of these masses have attenuation values placing them in indeterminate category with respect to etiology. Kidneys bilaterally show no mass or hydronephrosis on either side. There is an extrarenal pelvis on the right, an anatomic variant. There is no appreciable renal or ureteral calculus on either side.  Urinary bladder is midline with wall thickness within normal limits. Stomach/Bowel: Patient has had previous gastric bypass surgery. There is a somewhat swirled appearance to the mesentery in the left lower abdomen which raises concern for an internal hernia. No bowel extends into this area of questionable internal hernia. Note that the cecum bends upon itself with the cecum located in the midline with dilatation. The appearance is felt to represent a degree of cecal volvulus. Note somewhat swirled appearance in the area of the cecal volvulus which may represent a second internal hernia. There is mesenteric inflammation/thickening at the level of the terminal ileum. There is no fistula or appreciable bowel wall thickening involving the terminal ileum. The nearby appendix appears unremarkable. In the left mid abdomen, there is a focus of jejunojejunal intussusception extending over a distance of 4.0 cm. This finding is best seen on axial slices 39 through 43 series 3 and coronal slices 82 through 93 series 6. There is no associated bowel wall thickening in this area of intussusception. No well-defined small-bowel obstruction evident. No free air or portal venous air. Vascular/Lymphatic: No abdominal aortic aneurysm. No arterial vascular lesions evident. Major venous structures appear patent. No adenopathy in the abdomen or pelvis. Reproductive: Uterus absent.  No adnexal masses. Other: No abscess or ascites in the abdomen or pelvis. Musculoskeletal: No blastic or lytic bone lesions. Foci of degenerative change noted in the lumbar spine. No intramuscular or abdominal wall lesions evident. IMPRESSION: 1. 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 No free air or pneumatosis. Nearby mesenteric thickening near the ileocecal junction noted. The terminal ileum itself is not felt to be inflamed. There is a questionable internal hernia in this immediate vicinity with a somewhat swirled appearance of the vessels near the area of  cecal volvulus. Nearby appendix appears unremarkable. Surgical consultation advised. 2. Suspected second focus of internal hernia not containing bowel seen in the left upper pelvis, best appreciated on axial slices 58 through 63 series 3. 3. Focus of jejunal-jejunal intussusception in the left mid abdomen with approximately 4 cm of involvement of intussusception in this area. Surgical consultation may be advised given this finding in light of other findings. At a minimum, this finding will warrant CT enterography based on its length. 4.  Foci of apparent pneumonia right lower lobe. 5. Previous gastric bypass procedure. No gastric wall thickening or fluid. Note that gastric bypass procedure increases risk for internal hernia development. 6. Gallbladder absent. Intrahepatic biliary duct dilatation. No appreciable common bile duct dilatation. 7. 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 The larger of these adrenal masses on the right may warrant surgical consultation. Per consensus guidelines, the smaller mass in the left warrants repeat CT in 1 year to further assess. 8.  Fairly small hiatal hernia. Electronically Signed   By: Bretta Bang III M.D.   On: 08/05/2020 13:07  Anti-infectives: Anti-infectives (From admission, onward)   Start     Dose/Rate Route Frequency Ordered Stop   08/06/20 0600  ceFAZolin (ANCEF) IVPB 2g/100 mL premix        2 g 200 mL/hr over 30 Minutes Intravenous On call to O.R. 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 COVID + - currently asymptomatic and on room air. If she develops symptoms or oxygen requirement will  consult TRH. Can hold off for now. Patient is vaccinated.  Hyperglycemia - glucose 216 this AM. No hx DM. A1c. AM labs.   Cecal Volvulus  S/p Exploratory laparotomy, right hemicolectomy with anastomosis - Dr. Derrell Lolling - 08/05/2020 - POD #1 - Keep on liquids, AROBF - Foley out  - Mobilize - Begin BID WTD dressing changes - Pulm toilet   FEN - CLD, IVF VTE - SCDs, Lovenox  ID - Ancef 11/18 >>  Foley - d/c'd POD #1. Has not voided yet. Bladder scan in 4 hours if has not voided   LOS: 1 day    Jacinto Halim , [facility] Surgery 08/06/2020, 10:51 AM Please see Amion for pager number during day hours 7:00am-4:30pm

## 2020-08-06 NOTE — Progress Notes (Signed)
   08/06/20 1115  Clinical Encounter Type  Visited With Patient not available  Visit Type Spiritual support  Referral From Nurse  Consult/Referral To Chaplain  Chaplain responded to consult. Chaplain spoke with the Unit secretary and the patient is not available at this time. Chaplain called patient and no answer. Please contact Spiritual Care when patient is available to speak. This note was prepared by Deneen Harts, M.Div..  For questions please contact by phone [phone].

## 2020-08-06 NOTE — Anesthesia Postprocedure Evaluation (Signed)
Anesthesia Post Note  Patient: Stacey Mcdaniel  Procedure(s) Performed: EXPLORATORY LAPAROTOMY POSSIBLE SMALL BOWEL RESECTION/POSSIBLE COLECTOMY (N/A )     Patient location during evaluation: Other (Patient recovered in OR due to Covid-19 status.) Anesthesia Type: General Level of consciousness: awake and alert Pain management: pain level controlled Vital Signs Assessment: post-procedure vital signs reviewed and stable Respiratory status: spontaneous breathing, nonlabored ventilation, respiratory function stable and patient connected to nasal cannula oxygen Cardiovascular status: blood pressure returned to baseline and stable Postop Assessment: no apparent nausea or vomiting Anesthetic complications: no   No complications documented.  Last Vitals:  Vitals:   08/06/20 0451 08/06/20 0758  BP: 122/78 (!) 149/79  Pulse: 74 85  Resp: 10 13  Temp: 36.7 C 36.7 C  SpO2: 99% 97%    Last Pain:  Vitals:   08/06/20 1125  TempSrc:   PainSc: 7                  Ermias Tomeo COKER

## 2020-08-06 NOTE — Progress Notes (Signed)
Notified by patients RN that patient was bleeding from midline wound. Came to assess. There was a small area of oozing from the left lower portion of the wound. No pulsatile bleeding. This was controlled with figure of 8 stitch with 3-0 chromic gut suture x 2. Wound was hemostatic and redressed. Patient without tachycardia or hypotension. Will plan for routine labs in the AM.   Leary Roca, [facility] Surgery

## 2020-08-06 NOTE — TOC Initial Note (Signed)
Transition of Care [facility]) - Initial/Assessment Note    Patient Details  Name: Stacey Mcdaniel MRN: [id number] Date of Birth: [birth year]  Transition of Care [facility]) CM/SW Contact:    Lockie Pares, RN Phone Number: 08/06/2020, 8:50 AM  Clinical Narrative:                 Patient admitted with abdominal pain, cecal volvus, surgery last night positive COVID prior to surgery. Has a nephew and friend as NOK. Lives in [place]. CM will follow for needs.   Expected Discharge Plan: Home/Self Care Barriers to Discharge: Continued Medical Work up   Patient Goals and CMS Choice        Expected Discharge Plan and Services Expected Discharge Plan: Home/Self Care   Discharge Planning Services: CM Consult   Living arrangements for the past 2 months: Apartment                                      Prior Living Arrangements/Services Living arrangements for the past 2 months: Apartment Lives with:: Self Patient language and need for interpreter reviewed:: Yes        Need for Family Participation in Patient Care: Yes (Comment) Care giver support system in place?: Yes (comment)   Criminal Activity/Legal Involvement Pertinent to Current Situation/Hospitalization: No - Comment as needed  Activities of Daily Living Home Assistive Devices/Equipment: None ADL Screening (condition at time of admission) Patient's cognitive ability adequate to safely complete daily activities?: Yes Is the patient deaf or have difficulty hearing?: No Does the patient have difficulty seeing, even when wearing glasses/contacts?: No Does the patient have difficulty concentrating, remembering, or making decisions?: No Patient able to express need for assistance with ADLs?: Yes Does the patient have difficulty dressing or bathing?: No Independently performs ADLs?: Yes (appropriate for developmental age) Does the patient have difficulty walking or climbing stairs?: No Weakness of Legs: None Weakness of  Arms/Hands: None  Permission Sought/Granted                  Emotional Assessment       Orientation: : Oriented to Self, Oriented to Place, Oriented to  Time, Oriented to Situation Alcohol / Substance Use: Not Applicable Psych Involvement: No (comment)  Admission diagnosis:  Intussusception (HCC) [K56.1] Volvulus (HCC) [K56.2] Internal hernia [K45.8] Cecal volvulus (HCC) [K56.2] Patient Active Problem List   Diagnosis Date Noted  . 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 Nondisplaced fracture of fifth left metatarsal bone with routine healing 09/25/2017   PCP:  Buckner Malta, MD Pharmacy:   [facility] [address], [place] - [address] [place] [address] Phone: [phone] Fax: [phone]     Social Determinants of Health (SDOH) Interventions    Readmission Risk Interventions No flowsheet data found.

## 2020-08-07 ENCOUNTER — Encounter (HOSPITAL_COMMUNITY): Payer: Self-pay | Admitting: General Surgery

## 2020-08-07 LAB — CBC
HCT: 31.6 % — ABNORMAL LOW (ref 36.0–46.0)
Hemoglobin: 10.1 g/dL — ABNORMAL LOW (ref 12.0–15.0)
MCH: 29.7 pg (ref 26.0–34.0)
MCHC: 32 g/dL (ref 30.0–36.0)
MCV: 92.9 fL (ref 80.0–100.0)
Platelets: 313 10*3/uL (ref 150–400)
RBC: 3.4 MIL/uL — ABNORMAL LOW (ref 3.87–5.11)
RDW: 12.8 % (ref 11.5–15.5)
WBC: 6.8 10*3/uL (ref 4.0–10.5)
nRBC: 0 % (ref 0.0–0.2)

## 2020-08-07 LAB — BASIC METABOLIC PANEL
Anion gap: 9 (ref 5–15)
BUN: <5 mg/dL — ABNORMAL LOW (ref 6–20)
CO2: 25 mmol/L (ref 22–32)
Calcium: 8.2 mg/dL — ABNORMAL LOW (ref 8.9–10.3)
Chloride: 107 mmol/L (ref 98–111)
Creatinine, Ser: 0.52 mg/dL (ref 0.44–1.00)
GFR, Estimated: >60 mL/min (ref >60)
Glucose, Bld: 92 mg/dL (ref 70–99)
Potassium: 3.8 mmol/L (ref 3.5–5.1)
Sodium: 141 mmol/L (ref 135–145)

## 2020-08-07 LAB — HEMOGLOBIN A1C
Hgb A1c MFr Bld: 5.3 % (ref 4.8–5.6)
Mean Plasma Glucose: 105.41 mg/dL

## 2020-08-07 MED ORDER — BUPROPION HCL ER (XL) 150 MG PO TB24
450.0000 mg | ORAL_TABLET | Freq: Every day | ORAL | Status: DC
  Administered 2020-08-07 – 2020-08-11 (×5): 450 mg via ORAL
  Filled 2020-08-07 (×5): qty 3

## 2020-08-07 NOTE — Progress Notes (Signed)
[facility] Progress Note  2 Days Post-Op  Subjective: CC-  Feeling nauseated this AM, no emesis. No flatus or BM. Full liquids making her feel worse. Urinating without issues. hgb 10.1 from 12.4, no active bleeding from wound today.  Objective: Vital signs in last 24 hours: Temp:  [98 F (36.7 C)-98.4 F (36.9 C)] 98.2 F (36.8 C) (11/20 0735) Pulse Rate:  [78-100] 89 (11/20 0735) Resp:  [17-18] 18 (11/20 0401) BP: (108-145)/(64-87) 133/87 (11/20 0735) SpO2:  [93 %-96 %] 94 % (11/20 0735) Weight:  [67.9 kg] 67.9 kg (11/20 0401) Last BM Date:  (PTA)  Intake/Output from previous day: 11/19 0701 - 11/20 0700 In: 2381.9 [I.V.:2381.9] Out: -  Intake/Output this shift: No intake/output data recorded.  PE: Gen:  Alert, NAD, pleasant Pulm: rate and effort normal on RA Abd: Soft, ND, appropriately tender around midline incision and right abdomen without peritonitis. Midline wound clean without drainage or active bleeding. Hypoactive bowel sounds.  Ext:  No LE edema  Psych: A&Ox3  Skin: no rashes noted, warm and dry  Lab Results:  Recent Labs    08/06/20 0021 08/07/20 0040  WBC 11.2* 6.8  HGB 12.4 10.1*  HCT 38.7 31.6*  PLT 362 313   BMET Recent Labs    08/06/20 0021 08/07/20 0040  NA 139 141  K 4.2 3.8  CL 104 107  CO2 25 25  GLUCOSE 216* 92  BUN 6 <5*  CREATININE 0.73 0.52  CALCIUM 8.9 8.2*   PT/INR No results for input(s): LABPROT, INR in the last 72 hours. CMP     Component Value Date/Time   NA 141 08/07/2020 0040   K 3.8 08/07/2020 0040   CL 107 08/07/2020 0040   CO2 25 08/07/2020 0040   GLUCOSE 92 08/07/2020 0040   BUN <5 (L) 08/07/2020 0040   CREATININE 0.52 08/07/2020 0040   CALCIUM 8.2 (L) 08/07/2020 0040   PROT 6.5 08/05/2020 1131   ALBUMIN 3.4 (L) 08/05/2020 1131   AST 28 08/05/2020 1131   ALT 20 08/05/2020 1131   ALKPHOS 112 08/05/2020 1131   BILITOT 1.1 08/05/2020 1131   GFRNONAA >60 08/07/2020 0040   Lipase      Component Value Date/Time   LIPASE 36 08/05/2020 1131       Studies/Results: CT ABDOMEN PELVIS W CONTRAST  Result Date: 08/05/2020 CLINICAL DATA:  Right lower abdominal pain EXAM: CT ABDOMEN AND PELVIS WITH CONTRAST TECHNIQUE: Multidetector CT imaging of the abdomen and pelvis was performed using the standard protocol following bolus administration of intravenous contrast. CONTRAST:  OMNIPAQUE IOHEXOL 300 MG/ML  SOLN COMPARISON:  None. FINDINGS: Lower chest: Areas of patchy airspace opacity are noted in the right lower lobe consistent with pneumonia. There is a focal hiatal hernia. Hepatobiliary: There is mild fatty infiltration near the fissure for the ligamentum teres. No focal liver lesions are evident. The gallbladder is absent. There is intrahepatic biliary duct dilatation. There does not appear to be appreciable common hepatic or common bile duct. No obstructing lesion evident in the biliary ductal system. Pancreas: There is no pancreatic mass or inflammatory focus. Spleen: No splenic lesions are evident. Adrenals/Urinary Tract: There is a mass arising from the right adrenal measuring 4.4 x 1.8 cm. There is a left adrenal mass measuring 2.3 x 1.4 cm. Both of these masses have attenuation values placing them in indeterminate category with respect to etiology. Kidneys bilaterally show no mass or hydronephrosis on either side. There is an extrarenal pelvis on  the right, an anatomic variant. There is no appreciable renal or ureteral calculus on either side. Urinary bladder is midline with wall thickness within normal limits. Stomach/Bowel: Patient has had previous gastric bypass surgery. There is a somewhat swirled appearance to the mesentery in the left lower abdomen which raises concern for an internal hernia. No bowel extends into this area of questionable internal hernia. Note that the cecum bends upon itself with the cecum located in the midline with dilatation. The appearance is felt to  represent a degree of cecal volvulus. Note somewhat swirled appearance in the area of the cecal volvulus which may represent a second internal hernia. There is mesenteric inflammation/thickening at the level of the terminal ileum. There is no fistula or appreciable bowel wall thickening involving the terminal ileum. The nearby appendix appears unremarkable. In the left mid abdomen, there is a focus of jejunojejunal intussusception extending over a distance of 4.0 cm. This finding is best seen on axial slices 39 through 43 series 3 and coronal slices 82 through 93 series 6. There is no associated bowel wall thickening in this area of intussusception. No well-defined small-bowel obstruction evident. No free air or portal venous air. Vascular/Lymphatic: No abdominal aortic aneurysm. No arterial vascular lesions evident. Major venous structures appear patent. No adenopathy in the abdomen or pelvis. Reproductive: Uterus absent.  No adnexal masses. Other: No abscess or ascites in the abdomen or pelvis. Musculoskeletal: No blastic or lytic bone lesions. Foci of degenerative change noted in the lumbar spine. No intramuscular or abdominal wall lesions evident. IMPRESSION: 1. 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 No free air or pneumatosis. Nearby mesenteric thickening near the ileocecal junction noted. The terminal ileum itself is not felt to be inflamed. There is a questionable internal hernia in this immediate vicinity with a somewhat swirled appearance of the vessels near the area of cecal volvulus. Nearby appendix appears unremarkable. Surgical consultation advised. 2. Suspected second focus of internal hernia not containing bowel seen in the left upper pelvis, best appreciated on axial slices 58 through 63 series 3. 3. Focus of jejunal-jejunal intussusception in the left mid abdomen with approximately 4 cm of involvement of intussusception in this area. Surgical consultation may be advised given this finding in light  of other findings. At a minimum, this finding will warrant CT enterography based on its length. 4.  Foci of apparent pneumonia right lower lobe. 5. Previous gastric bypass procedure. No gastric wall thickening or fluid. Note that gastric bypass procedure increases risk for internal hernia development. 6. Gallbladder absent. Intrahepatic biliary duct dilatation. No appreciable common bile duct dilatation. 7. 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 The larger of these adrenal masses on the right may warrant surgical consultation. Per consensus guidelines, the smaller mass in the left warrants repeat CT in 1 year to further assess. 8.  Fairly small hiatal hernia. Electronically Signed   By: Bretta Bang III M.D.   On: 08/05/2020 13:07    Anti-infectives: Anti-infectives (From admission, onward)   Start     Dose/Rate Route Frequency Ordered Stop   08/06/20 0600  ceFAZolin (ANCEF) IVPB 2g/100 mL premix        2 g 200 mL/hr over 30 Minutes Intravenous On call to O.R. 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 COVID + - currently asymptomatic and on room air. If she develops symptoms or oxygen requirement will consult TRH. Can hold off for now. Patient is vaccinated.  Hyperglycemia - A1c 5.3 ABL anemia - Hgb 10.1 from 12.4, VSS, repeat CBC in AM  Cecal Volvulus  S/p Exploratory laparotomy, right hemicolectomy with anastomosis - Dr. Derrell Lolling - 08/05/2020 - POD #2 - surgical path pending - Back down to clear liquids and await return in bowel function. - Discussed importance of mobilizing - BID wet to dry dressing changes - Pulm toilet    FEN - CLD, IVF VTE - SCDs, Lovenox  ID - Ancef 11/18 >>  Foley - d/c 11/19 and voiding independently    LOS: 2 days    Franne Forts, [facility] Surgery 08/07/2020, 9:04 AM Please see Amion for pager number during day hours 7:00am-4:30pm

## 2020-08-08 LAB — CBC
HCT: 30.9 % — ABNORMAL LOW (ref 36.0–46.0)
Hemoglobin: 9.6 g/dL — ABNORMAL LOW (ref 12.0–15.0)
MCH: 29.4 pg (ref 26.0–34.0)
MCHC: 31.1 g/dL (ref 30.0–36.0)
MCV: 94.5 fL (ref 80.0–100.0)
Platelets: 355 10*3/uL (ref 150–400)
RBC: 3.27 MIL/uL — ABNORMAL LOW (ref 3.87–5.11)
RDW: 13.1 % (ref 11.5–15.5)
WBC: 5.1 10*3/uL (ref 4.0–10.5)
nRBC: 0 % (ref 0.0–0.2)

## 2020-08-08 LAB — BASIC METABOLIC PANEL
Anion gap: 7 (ref 5–15)
BUN: 5 mg/dL — ABNORMAL LOW (ref 6–20)
CO2: 30 mmol/L (ref 22–32)
Calcium: 8.5 mg/dL — ABNORMAL LOW (ref 8.9–10.3)
Chloride: 104 mmol/L (ref 98–111)
Creatinine, Ser: 0.61 mg/dL (ref 0.44–1.00)
GFR, Estimated: >60 mL/min (ref >60)
Glucose, Bld: 85 mg/dL (ref 70–99)
Potassium: 4 mmol/L (ref 3.5–5.1)
Sodium: 141 mmol/L (ref 135–145)

## 2020-08-08 LAB — MAGNESIUM: Magnesium: 1.9 mg/dL (ref 1.7–2.4)

## 2020-08-08 MED ORDER — MAGNESIUM SULFATE IN D5W 1-5 GM/100ML-% IV SOLN
1.0000 g | Freq: Once | INTRAVENOUS | Status: AC
  Administered 2020-08-08: 1 g via INTRAVENOUS
  Filled 2020-08-08: qty 100

## 2020-08-08 NOTE — Progress Notes (Signed)
[facility] Progress Note  3 Days Post-Op  Subjective: CC: Cecal volvulus s/p right partial colectomy   Interval History:  - Denies nausea or vomiting - Tolerated clear liquids well - Endorses flatus, no bowel movements yet - Pain well controlled - Abdominal packing progressing well without wound issues   Objective: Vital signs in last 24 hours: Temp:  [98.3 F (36.8 C)-99.1 F (37.3 C)] 98.6 F (37 C) (11/21 0606) Pulse Rate:  [82-89] 82 (11/21 0606) Resp:  [16-18] 18 (11/21 0606) BP: (124-142)/(77-85) 142/85 (11/21 0606) SpO2:  [96 %-98 %] 97 % (11/21 0606) Weight:  [68.3 kg] 68.3 kg (11/21 0404) Last BM Date:  (pta)  Intake/Output from previous day: 11/20 0701 - 11/21 0700 In: 1009.5 [I.V.:1009.5] Out: -  Intake/Output this shift: No intake/output data recorded.  PE: Gen:  Alert, NAD, pleasant Pulm: rate and effort normal on RA Abd: Soft, ND, appropriately tender around midline incision and right abdomen without peritonitis. Midline wound clean without drainage or active bleeding. Ext:  No LE edema  Psych: A&Ox3  Skin: no rashes noted, warm and dry  Lab Results:  Recent Labs    08/07/20 0040 08/08/20 0322  WBC 6.8 5.1  HGB 10.1* 9.6*  HCT 31.6* 30.9*  PLT 313 355   BMET Recent Labs    08/07/20 0040 08/08/20 0322  NA 141 141  K 3.8 4.0  CL 107 104  CO2 25 30  GLUCOSE 92 85  BUN <5* 5*  CREATININE 0.52 0.61  CALCIUM 8.2* 8.5*   PT/INR No results for input(s): LABPROT, INR in the last 72 hours. CMP     Component Value Date/Time   NA 141 08/08/2020 0322   K 4.0 08/08/2020 0322   CL 104 08/08/2020 0322   CO2 30 08/08/2020 0322   GLUCOSE 85 08/08/2020 0322   BUN 5 (L) 08/08/2020 0322   CREATININE 0.61 08/08/2020 0322   CALCIUM 8.5 (L) 08/08/2020 0322   PROT 6.5 08/05/2020 1131   ALBUMIN 3.4 (L) 08/05/2020 1131   AST 28 08/05/2020 1131   ALT 20 08/05/2020 1131   ALKPHOS 112 08/05/2020 1131   BILITOT 1.1 08/05/2020 1131    GFRNONAA >60 08/08/2020 0322   Lipase     Component Value Date/Time   LIPASE 36 08/05/2020 1131       Studies/Results: No results found.  Anti-infectives: Anti-infectives (From admission, onward)   Start     Dose/Rate Route Frequency Ordered Stop   08/06/20 0600  ceFAZolin (ANCEF) IVPB 2g/100 mL premix        2 g 200 mL/hr over 30 Minutes Intravenous On call to O.R. 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 COVID + - currently asymptomatic and on room air. If she develops symptoms or oxygen requirement will consult TRH. Can hold off for now. Patient is vaccinated.  Hyperglycemia - A1c 5.3 ABL anemia - Hgb 9.6 from 10.1 from 12.4, VSS, repeat CBC in AM  Cecal Volvulus  S/p Exploratory laparotomy, right hemicolectomy with anastomosis - Dr. Derrell Lolling - 08/05/2020 - POD #3 - surgical path pending - Advance to Bariatric advanced diet given +flatus - OOB, walk - BID wet to dry  dressing changes - Pulm toilet   FEN - Bariatric Advanced, turn off IVF today VTE - SCDs, Lovenox  ID - ABx complete Foley - d/c 11/19 and voiding independently    LOS: 3 days    Stacey Donath, MD [facility] 08/08/2020, 9:42 AM Please see Amion for pager number during day hours 7:00am-4:30pm

## 2020-08-09 LAB — SURGICAL PATHOLOGY

## 2020-08-09 NOTE — Progress Notes (Signed)
Progress Note  4 Days Post-Op  Subjective: Patient reports some abdominal soreness and more pain with coughing but overall doing well. +flatus, no BM. Denies nausea and tolerating bariatric advanced diet. Some allergy symptoms but no SOB or chest pain. Patient has been vaccinated. She does not have anyone who can help her change midline dressing at home and is not able to do this herself, we discussed placing VAC today to decrease burden of wound care.   Objective: Vital signs in last 24 hours: Temp:  [98.3 F (36.8 C)-98.5 F (36.9 C)] 98.3 F (36.8 C) (11/22 0512) Pulse Rate:  [86-94] 86 (11/22 0512) Resp:  [17-20] 17 (11/22 0512) BP: (126-137)/(77-87) 126/81 (11/22 0512) SpO2:  [94 %-98 %] 98 % (11/22 0512) Weight:  [68.3 kg] 68.3 kg (11/22 0512) Last BM Date:  (pta)  Intake/Output from previous day: 11/21 0701 - 11/22 0700 In: 360 [P.O.:360] Out: -  Intake/Output this shift: No intake/output data recorded.  PE: General: pleasant, WD, WN female who is laying in bed in NAD Heart: regular, rate, and rhythm.   Lungs: CTAB, no wheezes, rhonchi, or rales noted.  Respiratory effort nonlabored Abd: soft, appropriately ttp, ND, midline wound clean  MS: all 4 extremities are symmetrical with no cyanosis, clubbing, or edema. Skin: warm and dry with no masses, lesions, or rashes Neuro: Cranial nerves 2-12 grossly intact, sensation is normal throughout Psych: A&Ox3 with an appropriate affect.    Lab Results:  Recent Labs    08/07/20 0040 08/08/20 0322  WBC 6.8 5.1  HGB 10.1* 9.6*  HCT 31.6* 30.9*  PLT 313 355   BMET Recent Labs    08/07/20 0040 08/08/20 0322  NA 141 141  K 3.8 4.0  CL 107 104  CO2 25 30  GLUCOSE 92 85  BUN <5* 5*  CREATININE 0.52 0.61  CALCIUM 8.2* 8.5*   PT/INR No results for input(s): LABPROT, INR in the last 72 hours. CMP     Component Value Date/Time   NA 141 08/08/2020 0322   K 4.0 08/08/2020 0322   CL 104 08/08/2020 0322   CO2 30  08/08/2020 0322   GLUCOSE 85 08/08/2020 0322   BUN 5 (L) 08/08/2020 0322   CREATININE 0.61 08/08/2020 0322   CALCIUM 8.5 (L) 08/08/2020 0322   PROT 6.5 08/05/2020 1131   ALBUMIN 3.4 (L) 08/05/2020 1131   AST 28 08/05/2020 1131   ALT 20 08/05/2020 1131   ALKPHOS 112 08/05/2020 1131   BILITOT 1.1 08/05/2020 1131   GFRNONAA >60 08/08/2020 0322   Lipase     Component Value Date/Time   LIPASE 36 08/05/2020 1131       Studies/Results: No results found.  Anti-infectives: Anti-infectives (From admission, onward)   Start     Dose/Rate Route Frequency Ordered Stop   08/06/20 0600  ceFAZolin (ANCEF) IVPB 2g/100 mL premix        2 g 200 mL/hr over 30 Minutes Intravenous On call to O.R. 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 COVID + - currently asymptomatic and on room air. If she develops symptoms or oxygen requirement will consult TRH. Can hold off for now. Patient is vaccinated.  -  will discuss quarantine with ID today given patient will need HH for wound care Hyperglycemia - A1c 5.3 ABL anemia - Hgb 9.6 yesterday, stable, VSS  Cecal Volvulus S/pExploratory laparotomy, right hemicolectomy with anastomosis- Dr. Derrell Lolling - 08/05/2020 - POD #4 - surgical path pending - continue Bariatric advanced diet  - OOB, walk - will order VAC today - will need to be set up for home VAC as well and determine when [facility] RN would be cleared to see patient  - Pulm toilet  FEN -Bariatric Advanced VTE -SCDs, Lovenox ID -ABx complete Foley - d/c 11/19 and voiding independently   LOS: 4 days    Juliet Rude , [facility] Surgery 08/09/2020, 10:35 AM Please see Amion for pager number during day hours  7:00am-4:30pm

## 2020-08-09 NOTE — TOC Progression Note (Signed)
Transition of Care [facility]) - Progression Note    Patient Details  Name: VINETTE CRITES MRN: [id number] Date of Birth: 02-24-1966  Transition of Care [facility]) CM/SW Contact  Beckie Busing, RN Phone Number: [phone]  08/09/2020, 3:53 PM  Clinical Narrative:    CM received call from patient insurance company [address]. Toniann Fail [phone])  Per Toniann Fail the patient insurance plan will cover wound vac and [facility] is the preferred provider. Patient will also have access to Home health and the preferred providers are [facility], [place], [place] home care and Home Health professionals of [place]. There are the preferred [facility] agencies that insurance will pay. Other agencies are not covered in the patients healthcare plan.    Expected Discharge Plan: Home/Self Care Barriers to Discharge: Continued Medical Work up  Expected Discharge Plan and Services Expected Discharge Plan: Home/Self Care   Discharge Planning Services: CM Consult   Living arrangements for the past 2 months: Apartment                                       Social Determinants of Health (SDOH) Interventions    Readmission Risk Interventions No flowsheet data found.

## 2020-08-09 NOTE — Progress Notes (Signed)
Negative pressure wound therapy applied to Pt's ABD midline open incision. Wound measured 9cm length, 6.2cm width, and 2.3cm at deepest area. 1 single piece black foam used. VAC on at 130mm/Hg, continuous. Seal intact. Pt tolerated well. Will continue to monitor.     08/09/20 1825  Incision (Closed) 08/05/20 Abdomen Other (Comment)  Date First Assessed/Time First Assessed: 08/05/20 1759   Location: Abdomen  Location Orientation: Other (Comment)  Dressing Type Negative pressure wound therapy  Dressing Change Frequency Monday, Wednesday, Friday  Site / Wound Assessment Clean;Pink;Red;Granulation tissue  Incision Length (cm) 9 cm  Treatment Negative pressure wound therapy  Negative Pressure Wound Therapy Abdomen Mid  Placement Date/Time: 08/09/20 1825   Wound Type: Surgical (Open wound)  Location: Abdomen  Location Orientation: Mid  Site / Wound Assessment Clean;Pink;Red;Granulation tissue  Peri-wound Assessment Intact  Size  (9 x 6.2 x 2.3 cm)  Wound filler - Black foam 1  Cycle Continuous  Target Pressure (mmHg) 125  Dressing Status Intact  Drainage Amount None

## 2020-08-10 ENCOUNTER — Encounter (HOSPITAL_COMMUNITY): Payer: Self-pay | Admitting: General Surgery

## 2020-08-10 MED ORDER — MAGNESIUM HYDROXIDE 400 MG/5ML PO SUSP
30.0000 mL | Freq: Once | ORAL | Status: AC
  Administered 2020-08-10: 30 mL via ORAL
  Filled 2020-08-10: qty 30

## 2020-08-10 MED ORDER — POLYETHYLENE GLYCOL 3350 17 G PO PACK
17.0000 g | PACK | Freq: Every day | ORAL | Status: DC
  Administered 2020-08-10 – 2020-08-11 (×2): 17 g via ORAL
  Filled 2020-08-10 (×2): qty 1

## 2020-08-10 NOTE — TOC Transition Note (Signed)
Transition of Care [facility]) - CM/SW Discharge Note   Patient Details  Name: Stacey Mcdaniel MRN: [id number] Date of Birth: 09-05-1966  Transition of Care [facility]) CM/SW Contact:  Lawerance Sabal, RN Phone Number: 08/10/2020, 12:49 PM   Clinical Narrative:   Orders for [facility] sent to [facility]. It will be delivered to unit Wednesday. Patient will need changes MWF, and will need changed prior to her discharge. Frances Furbish accepted for [facility] services with start of care Friday.      Barriers to Discharge: Continued Medical Work up   Patient Goals and CMS Choice Patient states their goals for this hospitalization and ongoing recovery are:: to go home      Discharge Placement                       Discharge Plan and Services   Discharge Planning Services: CM Consult Post Acute Care Choice: Home Health, Durable Medical Equipment          DME Arranged: Vac DME Agency: [facility] Date DME Agency Contacted: 08/10/20 Time DME Agency Contacted: 1248 Representative spoke with at DME Agency: [facility] HH Arranged: RN HH Agency: [facility] Date [facility] Agency Contacted: 08/10/20 Time HH Agency Contacted: 1248 Representative spoke with at [facility] Agency: Kandee Keen  Social Determinants of Health (SDOH) Interventions     Readmission Risk Interventions No flowsheet data found.

## 2020-08-10 NOTE — Discharge Summary (Signed)
[facility] Discharge Summary   Patient ID: Stacey Mcdaniel MRN: [id number] DOB/AGE: 1966-03-14 54 y.o.  Admit date: 08/05/2020 Discharge date: 08/11/2020  Admitting Diagnosis: Cecal volvulus   Discharge Diagnosis Cecal volvulus  COVID-19 infection  Incidental finding of adrenal masses on CT  Consultants None   Imaging: No results found.  Procedures Dr. Derrell Lolling (08/05/20) - Exploratory laparotomy, Right hemicolectomy with anastomosis  Hospital Course:  Patient is a 54 year old female who presented to [facility] with abdominal pain.  Workup showed cecal volvulus.  Patient was admitted and underwent procedure listed above.  Tolerated procedure well and was transferred to the floor.  Diet was advanced as tolerated.  On POD#6, the patient was voiding well, tolerating diet, ambulating well, pain well controlled, vital signs stable, wound stable and felt stable for discharge home. Of note, patient tested positive for COVID-19 when screened pre-operatively. She remained asymptomatic from this and had actually been vaccinated x3. Patient did not have anyone to help with dressing changes daily at home and wound remained clean post-operatively, VAC placed and will be continued upon discharge. Patient will follow up in our office in 4 weeks and knows to call with questions or concerns. She will call to confirm appointment date/time.    I or a member of my team have reviewed this patient in the Controlled Substance Database.   Allergies as of 08/11/2020   No Known Allergies     Medication List    TAKE these medications   acetaminophen 325 MG tablet Commonly known as: TYLENOL Take 650 mg by mouth every 6 (six) hours as needed for mild pain or headache.   buPROPion 150 MG 24 hr tablet Commonly known as: WELLBUTRIN XL Take 450 mg by mouth daily.   dexmethylphenidate 20 MG 24 hr capsule Commonly known as: FOCALIN XR Take 20 mg by mouth daily.   diclofenac Sodium 1 %  Gel Commonly known as: Voltaren Apply 4 g topically 4 (four) times daily.   docusate sodium 100 MG capsule Commonly known as: COLACE Take 1 capsule (100 mg total) by mouth daily as needed for mild constipation.   levothyroxine 125 MCG tablet Commonly known as: SYNTHROID Take 125 mcg by mouth daily before breakfast.   oxyCODONE 5 MG immediate release tablet Commonly known as: Oxy IR/ROXICODONE Take 1 tablet (5 mg total) by mouth every 6 (six) hours as needed for moderate pain or severe pain.   Ozempic (0.25 or 0.5 MG/DOSE) 2 MG/1.5ML Sopn Generic drug: Semaglutide(0.25 or 0.5MG /DOS) Inject 0.5 mg into the skin every Friday.   polyethylene glycol 17 g packet Commonly known as: MIRALAX / GLYCOLAX Take 17 g by mouth daily as needed for mild constipation.   PROBIOTIC PO Take 1 capsule by mouth daily.   pyridOXINE 100 MG tablet Commonly known as: VITAMIN B-6 Take 100 mg by mouth daily.            Durable Medical Equipment  (From admission, onward)         Start     Ordered   08/09/20 1038  For home use only DME Negative pressure wound device  Once       Question Answer Comment  Frequency of dressing change 3 times per week   Length of need 3 Months   Dressing type Foam   Amount of suction 125 mm/Hg   Pressure application Continuous pressure   Supplies 10 canisters and 15 dressings per month for duration of therapy  08/09/20 1037            Follow-up Information    Buckner Malta, MD. Schedule an appointment as soon as possible for a visit.   Specialty: Family Medicine Why: 1-2 weeks Contact information: [address] [place] [address] [phone]        Axel Filler, MD. Go on 09/21/2020.   Specialty: General Surgery Why: Follow up appointment scheduled for 3:30 PM. Please arrive 30 min prior to appointment time. Bring photo ID and insurance information.  Contact information: [address] [place]  [address] [phone]        Care, [facility] Follow up.   Specialty: Home Health Services Why: For home health services. They will begin Friday for wound vac dressing changes Contact information: [address] [place] [address] [phone]               Signed: Juliet Rude , [facility] Surgery 08/11/2020, 1:54 PM Please see Amion for pager number during day hours 7:00am-4:30pm

## 2020-08-10 NOTE — Progress Notes (Signed)
5 Days Post-Op  Subjective: CC: Doing well. No abdominal pain today. Occasional nausea. No emesis. She is passing flatus. No bm. Mobilizing. Voiding without difficulty.  She denies nasal congestion, lost of taste/smell, cp, sob, or productive cough.    Objective: Vital signs in last 24 hours: Temp:  [98.1 F (36.7 C)-98.7 F (37.1 C)] 98.7 F (37.1 C) (11/23 0900) Pulse Rate:  [84-100] 91 (11/23 0900) Resp:  [16-18] 18 (11/23 0900) BP: (117-125)/(75-80) 117/75 (11/23 0900) SpO2:  [96 %-99 %] 97 % (11/23 0900) Weight:  [68.3 kg] 68.3 kg (11/23 0500) Last BM Date:  (pta)  Intake/Output from previous day: 11/22 0701 - 11/23 0700 In: 720 [P.O.:720] Out: -  Intake/Output this shift: No intake/output data recorded.  PE: Gen:  Alert, NAD, pleasant Card:  RRR Pulm:  CTAB, no W/R/R, effort normal. On RA Abd: Soft, NT/ND, +BS, wound vac in place with scant to no output in cannister Ext:  No LE edema Psych: A&Ox3  Skin: no rashes noted, warm and dry  Lab Results:  Recent Labs    08/08/20 0322  WBC 5.1  HGB 9.6*  HCT 30.9*  PLT 355   BMET Recent Labs    08/08/20 0322  NA 141  K 4.0  CL 104  CO2 30  GLUCOSE 85  BUN 5*  CREATININE 0.61  CALCIUM 8.5*   PT/INR No results for input(s): LABPROT, INR in the last 72 hours. CMP     Component Value Date/Time   NA 141 08/08/2020 0322   K 4.0 08/08/2020 0322   CL 104 08/08/2020 0322   CO2 30 08/08/2020 0322   GLUCOSE 85 08/08/2020 0322   BUN 5 (L) 08/08/2020 0322   CREATININE 0.61 08/08/2020 0322   CALCIUM 8.5 (L) 08/08/2020 0322   PROT 6.5 08/05/2020 1131   ALBUMIN 3.4 (L) 08/05/2020 1131   AST 28 08/05/2020 1131   ALT 20 08/05/2020 1131   ALKPHOS 112 08/05/2020 1131   BILITOT 1.1 08/05/2020 1131   GFRNONAA >60 08/08/2020 0322   Lipase     Component Value Date/Time   LIPASE 36 08/05/2020 1131       Studies/Results: No results found.  Anti-infectives: Anti-infectives (From admission, onward)    Start     Dose/Rate Route Frequency Ordered Stop   08/06/20 0600  ceFAZolin (ANCEF) IVPB 2g/100 mL premix        2 g 200 mL/hr over 30 Minutes Intravenous On call to O.R. 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 COVID + - currently asymptomatic and on room air. If she develops symptoms or oxygen requirement will consult TRH. Can hold off for now. Patient is vaccinated.  - will discuss quarantine with ID today given patient will need HH for wound care Hyperglycemia - A1c 5.3 ABL anemia - Hgb9.6 11/21, stable, VSS  Cecal Volvulus S/pExploratory laparotomy, right hemicolectomy with anastomosis- Dr. Derrell Lolling - 08/05/2020 - POD #5 - surgical path as below. No evidence of malignancy -Continue Bariatric advanced diet  -OOB, walk - Wound vac. Will discuss with TOC when patient would be able to have first wound vac change given covid + - Pulm toilet  FEN -Bariatric  Advanced VTE -SCDs, Lovenox ID -ABx complete Foley - d/c 11/19 and voiding independently Follow-Up - Dr. Derrell Lolling  A. COLON, RIGHT AND TERMINAL ILEUM, RESECTION:  - Benign colonic mucosa with ischemic necrosis.  - Serosal adhesions.  - Benign appendix.  - Four benign lymph nodes    LOS: 5 days    Jacinto Halim , [facility] 08/10/2020, 9:45 AM Please see Amion for pager number during day hours 7:00am-4:30pm

## 2020-08-11 ENCOUNTER — Other Ambulatory Visit (HOSPITAL_COMMUNITY): Payer: Self-pay | Admitting: Physician Assistant

## 2020-08-11 MED ORDER — DOCUSATE SODIUM 100 MG PO CAPS: 100.0000 mg | ORAL_CAPSULE | Freq: Every day | ORAL | Status: DC | PRN

## 2020-08-11 MED ORDER — POLYETHYLENE GLYCOL 3350 17 G PO PACK: 17.0000 g | PACK | Freq: Two times a day (BID) | ORAL | Status: DC

## 2020-08-11 MED ORDER — BISACODYL 10 MG RE SUPP
10.0000 mg | Freq: Once | RECTAL | Status: AC
  Administered 2020-08-11: 10 mg via RECTAL
  Filled 2020-08-11: qty 1

## 2020-08-11 MED ORDER — POLYETHYLENE GLYCOL 3350 17 G PO PACK: 17.0000 g | PACK | Freq: Every day | ORAL | Status: DC | PRN

## 2020-08-11 MED ORDER — OXYCODONE HCL 5 MG PO TABS: 5.0000 mg | ORAL_TABLET | Freq: Four times a day (QID) | ORAL | 0 refills | Status: DC | PRN

## 2020-08-11 MED FILL — oxyCODONE HCL 5 MG TABS: 5 | 7 days supply | Qty: 30 | Fill #0

## 2020-08-11 NOTE — Discharge Instructions (Signed)
[facility], [place] [phone]  OPEN ABDOMINAL SURGERY: POST OP INSTRUCTIONS  Always review your discharge instruction sheet given to you by the facility where your surgery was performed.  IF YOU HAVE DISABILITY OR FAMILY LEAVE FORMS, YOU MUST BRING THEM TO THE OFFICE FOR PROCESSING.  PLEASE DO NOT GIVE THEM TO YOUR DOCTOR.  1. A prescription for pain medication may be given to you upon discharge.  Take your pain medication as prescribed, if needed.  If narcotic pain medicine is not needed, then you may take acetaminophen (Tylenol) or ibuprofen (Advil) as needed. 2. Take your usually prescribed medications unless otherwise directed. 3. If you need a refill on your pain medication, please contact your pharmacy. They will contact our office to request authorization.  Prescriptions will not be filled after 5pm or on week-ends. 4. You should follow a light diet the first few days after arrival home, such as soup and crackers, pudding, etc.unless your doctor has advised otherwise. A high-fiber, low fat diet can be resumed as tolerated.   Be sure to include lots of fluids daily. Most patients will experience some swelling and bruising on the chest and neck area.  Ice packs will help.  Swelling and bruising can take several days to resolve 5. Most patients will experience some swelling and bruising in the area of the incision. Ice pack will help. Swelling and bruising can take several days to resolve..  6. It is common to experience some constipation if taking pain medication after surgery.  Increasing fluid intake and taking a stool softener will usually help or prevent this problem from occurring.  A mild laxative (Milk of Magnesia or Miralax) should be taken according to package directions if there are no bowel movements after 48 hours. 7.  You may have steri-strips (small skin tapes) in place directly over the incision.  These strips should be left on the skin for 7-10 days.  If your  surgeon used skin glue on the incision, you may shower in 24 hours.  The glue will flake off over the next 2-3 weeks.  Any sutures or staples will be removed at the office during your follow-up visit. You may find that a light gauze bandage over your incision may keep your staples from being rubbed or pulled. You may shower and replace the bandage daily. 8. ACTIVITIES:  You may resume regular (light) daily activities beginning the next day--such as daily self-care, walking, climbing stairs--gradually increasing activities as tolerated.  You may have sexual intercourse when it is comfortable.  Refrain from any heavy lifting or straining until approved by your doctor. a. You may drive when you no longer are taking prescription pain medication, you can comfortably wear a seatbelt, and you can safely maneuver your car and apply brakes  9. You should see your doctor in the office for a follow-up appointment approximately two weeks after your surgery.  Make sure that you call for this appointment within a day or two after you arrive home to insure a convenient appointment time.   WHEN TO CALL YOUR DOCTOR: 1. Fever over 101.0 2. Inability to urinate 3. Nausea and/or vomiting 4. Extreme swelling or bruising 5. Continued bleeding from incision. 6. Increased pain, redness, or drainage from the incision. 7. Difficulty swallowing or breathing 8. Muscle cramping or spasms. 9. Numbness or tingling in hands or feet or around lips.  The clinic staff is available to answer your questions during regular business hours.  Please don't hesitate to call and ask to speak to one of the nurses if you have concerns.  For further questions, please visit [email or web address]   Negative Pressure Wound Therapy Home Guide Negative pressure wound therapy (NPWT) uses a sponge or foam-like material (dressing) placed on or inside the wound. The wound is then covered and sealed with a cover dressing that sticks to  your skin (is adhesive). This keeps air out. A tube is attached to the cover dressing, and this tube connects to a small pump. The pump sucks fluid and germs from the wound. NPWT helps to increase blood flow to the wound and heal it from the inside. What are the risks? NPWT is usually safe to use. However, problems can occur, including:  Skin irritation from the dressing adhesive.  Bleeding.  Infection.  Dehydration. Wounds with large amounts of drainage can cause excessive fluid loss.  Pain. Supplies needed:  A disposable garbage bag.  Soap and water, or hand sanitizer.  Wound cleanser or salt-water solution (saline).  New sponge and cover dressing.  Protective clothing.  Gauze pad.  Vinyl gloves.  Tape.  Skin protectant. This may be a wipe, film, or spray.  Clean or germ-free (sterile) scissors.  Eye protection. How to change your dressing Prepare to change your dressing  1. If told by your health care provider, take pain medicine 30 minutes before changing the dressing. 2. Wash your hands with soap and water. Dry your hands with a clean towel. If soap and water are not available, use hand sanitizer. 3. Set up a clean station for wound care. 4. Open the dressing package so that the sponge dressing remains on the inside of the package. 5. Wear gloves, protective clothing, and eye protection. Remove old dressing  1. Turn off the pump and disconnect the tubing from the dressing. 2. Carefully remove the adhesive cover dressing in the direction of your hair growth. 3. Remove the sponge dressing that is inside the wound. If the sponge sticks, use a wound cleanser or saline solution to wet the sponge and help it come off more easily. 4. Throw the old sponge and cover dressing supplies into the garbage bag. 5. Remove your gloves by grabbing the cuff and turning the glove inside out. Place the gloves in the trash immediately. 6. Wash your hands with soap and water. Dry  your hands with a clean towel. If soap and water are not available, use hand sanitizer. Clean your wound  Wear gloves, protective clothing, and eye protection. Follow your health care provider's instructions on how to clean your wound. You may be told to: 1. Clean the wound using a saline solution or a wound cleanser and a clean gauze pad. 2. Pat the wound dry with a gauze pad. Do not rub the wound. 3. Throw the gauze pad into the garbage bag. 4. Remove your gloves by grabbing the cuff and turning the glove inside out. Place the gloves in the trash immediately. 5. Wash your hands with soap and water. Dry your hands with a clean towel. If soap and water are not available, use hand sanitizer. Apply new dressing  Wear gloves, protective clothing, and eye protection. 1. If told by your health care provider, apply a skin protectant to any skin that will be exposed to adhesive. Let the skin protectant dry. 2. Cut a piece of new sponge dressing and put it on or in the wound. 3. Using clean scissors, cut a nickel-sized hole in the new  cover dressing. 4. Apply the cover dressing. 5. Attach the suction tube over the hole in the cover dressing. 6. Take off your gloves. Put them in the plastic bag with the old dressing. Tie the bag shut and throw it away. 7. Wash your hands with soap and water. Dry your hands with a clean towel. If soap and water are not available, use hand sanitizer. 8. Turn the pump back on. The sponge dressing should collapse. Do not change the settings on the machine without talking to a health care provider. 9. Replace the container in the pump that collects fluid if it is full. Replace the container per the manufacturer's instructions or at least once a week, even if it is not full. General tips and recommendations If the alarm sounds:  Stay calm.  Do not turn off the pump or do anything with the dressing.  Reasons the alarm may go off: ? The battery is low. Change the  battery or plug the device into electrical power. ? The dressing has a leak. Find the leak and put tape over the leak. ? The fluid collection container is full. Change the fluid container.  Call your health care provider right away if you cannot fix the problem.  Explain to your health care provider what is happening. Follow his or her instructions. General instructions  Do not turn off the pump unless told to do so by your health care provider.  Do not turn off the pump for more than 2 hours. If the pump is off for more than 2 hours, the dressing will need to be changed.  If your health care provider says it is okay to shower: ? Do not take the pump into the shower. ? Make sure the wound dressing is protected and sealed. The wound dressing must stay dry.  Check frequently that the machine indicates that therapy is on and that all clamps are open.  Do not use over-the-counter medicated or antiseptic creams, sprays, liquids, or dressings unless your health care provider approves. Contact a health care provider if:  You have new pain.  You develop irritation, a rash, or itching around the wound or dressing.  You see new black or yellow tissue in your wound.  The dressing changes are painful or cause bleeding.  The pump has been off for more than 2 hours, and you do not know how to change the dressing.  The pump alarm goes off, and you do not know what to do. Get help right away if:  You have a lot of bleeding.  The wound breaks open.  You have severe pain.  You have signs of infection, such as: ? More redness, swelling, or pain. ? More fluid or blood. ? Warmth. ? Pus or a bad smell. ? Red streaks leading from the wound. ? A fever.  You see a sudden change in the color or texture of the drainage.  You have signs of dehydration, such as: ? Little or no tears, urine, or sweat. ? Muscle cramps. ? Very dry mouth. ? Headache. ? Dizziness. Summary  Negative pressure  wound therapy (NPWT) is a device that helps your wound heal.  Set up a clean station for wound care. Your health care provider will tell you what supplies to use.  Follow your health care provider's instructions on how to clean your wound and how to change the dressing.  Contact a health care provider if you have new pain, an irritation, or a rash, or  if the alarm goes off and you do not know what to do.  Get help right away if you have a lot of bleeding, your wound breaks open, or you have severe pain. Also, get help if you have signs of infection. This information is not intended to replace advice given to you by your health care provider. Make sure you discuss any questions you have with your health care provider. Document Revised: 12/27/2018 Document Reviewed: 11/22/2018 Elsevier Patient Education  [place].

## 2020-08-11 NOTE — Progress Notes (Signed)
6 Days Post-Op  Subjective: CC: Doing well. Having some gas pain in the lower abdomen on occasion that feels like she needs to have a BM. No abdominal pain this am. Tolerating diet without n/v. Mobilizing. Voiding without difficulty. Still no BM but is passing flatus.   She denies nasal congestion, lost of taste/smell, cp, sob, or productive cough.    Objective: Vital signs in last 24 hours: Temp:  [98.7 F (37.1 C)] 98.7 F (37.1 C) (11/24 0551) Pulse Rate:  [80-98] 80 (11/24 0551) Resp:  [19] 19 (11/24 0551) BP: (108-132)/(70-97) 132/97 (11/24 0551) SpO2:  [98 %-99 %] 99 % (11/24 0551) Weight:  [68.2 kg] 68.2 kg (11/24 0500) Last BM Date:  (pta)  Intake/Output from previous day: 11/23 0701 - 11/24 0700 In: 480 [P.O.:480] Out: -  Intake/Output this shift: No intake/output data recorded.  PE: Gen:  Alert, NAD, pleasant Card:  RRR Pulm:  CTAB, no W/R/R, effort normal. On RA Abd: Soft, NT/ND, +BS, wound vac in place with scant to no output in cannister Ext:  No LE edema Psych: A&Ox3  Skin: no rashes noted, warm and dry  Lab Results:  No results for input(s): WBC, HGB, HCT, PLT in the last 72 hours. BMET No results for input(s): NA, K, CL, CO2, GLUCOSE, BUN, CREATININE, CALCIUM in the last 72 hours. PT/INR No results for input(s): LABPROT, INR in the last 72 hours. CMP     Component Value Date/Time   NA 141 08/08/2020 0322   K 4.0 08/08/2020 0322   CL 104 08/08/2020 0322   CO2 30 08/08/2020 0322   GLUCOSE 85 08/08/2020 0322   BUN 5 (L) 08/08/2020 0322   CREATININE 0.61 08/08/2020 0322   CALCIUM 8.5 (L) 08/08/2020 0322   PROT 6.5 08/05/2020 1131   ALBUMIN 3.4 (L) 08/05/2020 1131   AST 28 08/05/2020 1131   ALT 20 08/05/2020 1131   ALKPHOS 112 08/05/2020 1131   BILITOT 1.1 08/05/2020 1131   GFRNONAA >60 08/08/2020 0322   Lipase     Component Value Date/Time   LIPASE 36 08/05/2020 1131       Studies/Results: No results  found.  Anti-infectives: Anti-infectives (From admission, onward)   Start     Dose/Rate Route Frequency Ordered Stop   08/06/20 0600  ceFAZolin (ANCEF) IVPB 2g/100 mL premix        2 g 200 mL/hr over 30 Minutes Intravenous On call to O.R. 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 COVID + - currently asymptomatic and on room air. If she develops symptoms or oxygen requirement will consult TRH. Can hold off for now. Patient is vaccinated. - will discuss quarantine with ID today given patient will need HH for wound care Hyperglycemia - A1c 5.3 ABL anemia - Hgb9.611/21, stable, VSS  Cecal Volvulus S/pExploratory laparotomy, right hemicolectomy with anastomosis- Dr. Derrell Lolling - 08/05/2020 - POD #6 - surgical path as below. No evidence of malignancy -ContinueBariatric advanced diet  -OOB, walk - Pulm toilet - Wound vac m/w/f. Patient to have vac change today with wocn and first dressing change with HH on Friday  FEN -Bariatric Advanced, bowel regimen, suppository  VTE -SCDs, Lovenox ID -Abx complete Foley - d/c 11/19 and voiding independently Follow-Up - Dr. Derrell Lolling Dispo - 5W. Suppository. Possible d/c later today if patient has a BM  Path A. COLON, RIGHT AND TERMINAL ILEUM, RESECTION:  - Benign colonic mucosa with ischemic necrosis.  - Serosal adhesions.  - Benign appendix.  - Four benign lymph nodes     LOS: 6 days    Jacinto Halim , [facility] 08/11/2020, 9:27 AM Please see Amion for pager number during day hours 7:00am-4:30pm

## 2020-08-11 NOTE — Progress Notes (Signed)
Patient discharging home. Vital signs stable at time of discharge as reflected in discharge summary. Discharge instructions given and verbal understanding returned. Patient discharging with wound vac with dressings and filled prescription. No questions at this time.

## 2020-10-14 NOTE — Progress Notes (Signed)
Subjective:    CC: L knee pain and swelling  I, Molly Weber, LAT, ATC, am serving as scribe for Dr. Clementeen Graham.  HPI: Pt is a 55 y/o female c/o L knee pain and swelling x 2-3 weeks. Pt reports in 2009 or 2010 she had surgery to clean out some debris in L knee. She locates her pain to anterior and medial-lateral aspect of the L knee.  L knee swelling: yes L knee mechanical symptoms: no Aggravating factors: walking Treatments tried: Compression knee sleeve and Voltaren gel.  Mild benefit.  Diganostic testing: L knee XR- 07/02/16  Pertinent review of Systems: No fevers or chills  Relevant historical information: History of intestinal volvulus.  History of left arthroscopic knee surgery 2009   Objective:    Vitals:   10/15/20 0801  BP: 130/86  Pulse: 94  SpO2: 100%   General: Well Developed, well nourished, and in no acute distress.   MSK: Left knee normal-appearing Normal motion with crepitation. Tender palpation medial joint line. Stable ligamentous exam. Positive medial McMurray's test. Intact strength.  Lab and Radiology Results  X-ray images left knee obtained today personally and independently interpreted Mild DJD.  No acute fractures. Await formal radiology review  Procedure: Real-time Ultrasound Guided Injection of left knee superior lateral patellar space Device: Philips Affiniti 50G Images permanently stored and available for review in PACS Ultrasound inspection prior to injection reveals narrowed medial joint line with partially absent medial meniscus.  Of note ultrasound images incorrectly labeled as right knee.  All images were of the left knee. Verbal informed consent obtained.  Discussed risks and benefits of procedure. Warned about infection bleeding damage to structures skin hypopigmentation and fat atrophy among others. Patient expresses understanding and agreement Time-out conducted.   Noted no overlying erythema, induration, or other signs  of local infection.   Skin prepped in a sterile fashion.   Local anesthesia: Topical Ethyl chloride.   With sterile technique and under real time ultrasound guidance:  40 mg of Kenalog and 2 mL of Marcaine injected into left knee. Fluid seen entering the knee joint.   Completed without difficulty   Pain immediately resolved suggesting accurate placement of the medication.   Advised to call if fevers/chills, erythema, induration, drainage, or persistent bleeding.   Images permanently stored and available for review in the ultrasound unit.  Impression: Technically successful ultrasound guided injection.        Impression and Recommendations:    Assessment and Plan: 55 y.o. female with right knee pain thought to be due to DJD exacerbation and probable medial meniscus tear.  Plan for steroid injection.  Patient is already tried compression knee sleeve and Voltaren gel.  Recheck back if not improving.  PDMP not reviewed this encounter. Orders Placed This Encounter  Procedures   [place] LIMITED JOINT SPACE STRUCTURES LOW LEFT(NO LINKED CHARGES)    Standing Status:   Future    Number of Occurrences:   1    Standing Expiration Date:   04/14/2021    Order Specific Question:   Reason for Exam (SYMPTOM  OR DIAGNOSIS REQUIRED)    Answer:   chronic left knee pain    Order Specific Question:   Preferred imaging location?    Answer:   [facility] Knee AP/LAT W/Sun[place] Left    Standing Status:   Future    Number of Occurrences:   1    Standing Expiration Date:   10/15/2021  Order Specific Question:   Reason for Exam (SYMPTOM  OR DIAGNOSIS REQUIRED)    Answer:   eval left knee pain    Order Specific Question:   Is patient pregnant?    Answer:   No    Order Specific Question:   Preferred imaging location?    Answer:   [facility]   No orders of the defined types were placed in this encounter.   Discussed warning signs or symptoms. Please see discharge  instructions. Patient expresses understanding.   The above documentation has been reviewed and is accurate and complete Clementeen Graham, M.D.

## 2020-10-15 ENCOUNTER — Ambulatory Visit: Payer: Self-pay

## 2020-10-15 ENCOUNTER — Ambulatory Visit (INDEPENDENT_AMBULATORY_CARE_PROVIDER_SITE_OTHER): Payer: 59

## 2020-10-15 ENCOUNTER — Ambulatory Visit: Payer: 59 | Admitting: Family Medicine

## 2020-10-15 ENCOUNTER — Other Ambulatory Visit: Payer: Self-pay

## 2020-10-15 VITALS — BP 130/86 | HR 94 | Ht 65.0 in | Wt 140.6 lb

## 2020-10-15 DIAGNOSIS — M25562 Pain in left knee: Secondary | ICD-10-CM | POA: Diagnosis not present

## 2020-10-15 DIAGNOSIS — G8929 Other chronic pain: Secondary | ICD-10-CM

## 2020-10-15 NOTE — Progress Notes (Signed)
X-ray shows medium arthritis in the knee joint.

## 2020-10-15 NOTE — Patient Instructions (Addendum)
Thank you for coming in today.  Please get an Xray today before you leave  Call or go to the ER if you develop a large red swollen joint with extreme pain or oozing puss.   Let me know how this goes.   Keep me updated.

## 2020-11-25 ENCOUNTER — Other Ambulatory Visit: Payer: Self-pay | Admitting: Family Medicine

## 2020-11-25 DIAGNOSIS — E049 Nontoxic goiter, unspecified: Secondary | ICD-10-CM

## 2020-12-10 ENCOUNTER — Ambulatory Visit
Admission: RE | Admit: 2020-12-10 | Discharge: 2020-12-10 | Disposition: A | Discharge status: Home / Self Care | Payer: 59 | Source: Ambulatory Visit | Attending: Family Medicine | Admitting: Family Medicine

## 2020-12-10 DIAGNOSIS — E049 Nontoxic goiter, unspecified: Secondary | ICD-10-CM

## 2021-09-08 ENCOUNTER — Other Ambulatory Visit: Payer: Self-pay

## 2021-09-08 ENCOUNTER — Ambulatory Visit: Payer: 59 | Admitting: Family Medicine

## 2021-09-08 ENCOUNTER — Ambulatory Visit: Payer: Self-pay

## 2021-09-08 VITALS — BP 124/82 | HR 78 | Wt 139.0 lb

## 2021-09-08 DIAGNOSIS — M25562 Pain in left knee: Secondary | ICD-10-CM

## 2021-09-08 DIAGNOSIS — G8929 Other chronic pain: Secondary | ICD-10-CM | POA: Diagnosis not present

## 2021-09-08 NOTE — Patient Instructions (Signed)
Good to see you.  You had a L knee injection.  Call or go to the ER if you develop a large red swollen joint with extreme pain or oozing puss.   Follow-up as needed.

## 2021-09-08 NOTE — Progress Notes (Signed)
I, Christoper Fabian, LAT, ATC, am serving as scribe for Dr. Clementeen Graham.  Stacey Mcdaniel is a 55 y.o. female who presents to [facility] at [facility] today for f/u of L knee pain and swelling.  She was last seen by Dr. Denyse Amass on 10/15/20 and had a L knee steroid injection.  She has a hx of a prior L knee arthroscopic surgery.  Today, pt reports that her L knee pain flared up over the last week.  She reports that her L knee pain has been swollen intermittently.  Diagnostic testing: L knee XR- 10/15/20  Pertinent review of systems: No fevers or chills  Relevant historical information: Cecal volvulus.  History of foot fracture.   Exam:  BP 124/82 (BP Location: Left Arm, Patient Position: Sitting, Cuff Size: Normal)    Pulse 78    Wt 139 lb (63 kg)    SpO2 97%    BMI 23.13 kg/m  General: Well Developed, well nourished, and in no acute distress.   MSK: Left knee mild effusion normal motion with crepitation.  Tender palpation medial joint line.    Lab and Radiology Results  Procedure: Real-time Ultrasound Guided Injection of left knee superior lateral patellar space Device: Philips Affiniti 50G Images permanently stored and available for review in PACS Verbal informed consent obtained.  Discussed risks and benefits of procedure. Warned about infection bleeding damage to structures skin hypopigmentation and fat atrophy among others. Patient expresses understanding and agreement Time-out conducted.   Noted no overlying erythema, induration, or other signs of local infection.   Skin prepped in a sterile fashion.   Local anesthesia: Topical Ethyl chloride.   With sterile technique and under real time ultrasound guidance: 40 mg of Kenalog and 2 mL of Marcaine injected into knee joint. Fluid seen entering the joint capsule.   Completed without difficulty   Pain immediately resolved suggesting accurate placement of the medication.   Advised to call if fevers/chills, erythema,  induration, drainage, or persistent bleeding.   Images permanently stored and available for review in the ultrasound unit.  Impression: Technically successful ultrasound guided injection.      EXAM: LEFT KNEE 3 VIEWS   COMPARISON:  None.   FINDINGS: Frontal, lateral, and sunrise patellar images were obtained. No fracture, dislocation, or joint effusion. There is moderately severe narrowing of the patellofemoral joint. Other joint spaces appear unremarkable. There is spurring in all compartments.   IMPRESSION: Moderately severe narrowing of the patellofemoral joint. Spurring in all compartments. No fracture, dislocation, or joint effusion.     Electronically Signed   By: Bretta Bang III M.D.   On: 10/15/2020 08:52   I, Clementeen Graham, personally (independently) visualized and performed the interpretation of the images attached in this note.      Assessment and Plan: 55 y.o. female with left knee pain thought to be due to exacerbation of patellofemoral DJD.  Plan for steroid injection today and continued quad strengthening exercises along with Voltaren gel.  Recheck back as needed.   PDMP not reviewed this encounter. Orders Placed This Encounter  Procedures   [place] LIMITED JOINT SPACE STRUCTURES LOW LEFT(NO LINKED CHARGES)    Order Specific Question:   Reason for Exam (SYMPTOM  OR DIAGNOSIS REQUIRED)    Answer:   L knee pain    Order Specific Question:   Preferred imaging location?    Answer:   [facility]   No orders of the defined types were placed in  this encounter.    Discussed warning signs or symptoms. Please see discharge instructions. Patient expresses understanding.   The above documentation has been reviewed and is accurate and complete Clementeen Graham, M.D.

## 2021-11-18 ENCOUNTER — Ambulatory Visit: Payer: 59 | Admitting: Sports Medicine

## 2021-11-18 ENCOUNTER — Encounter: Payer: Self-pay | Admitting: Sports Medicine

## 2021-11-18 ENCOUNTER — Other Ambulatory Visit: Payer: Self-pay

## 2021-11-18 DIAGNOSIS — M79672 Pain in left foot: Secondary | ICD-10-CM | POA: Diagnosis not present

## 2021-11-18 DIAGNOSIS — I739 Peripheral vascular disease, unspecified: Secondary | ICD-10-CM | POA: Diagnosis not present

## 2021-11-18 DIAGNOSIS — Q828 Other specified congenital malformations of skin: Secondary | ICD-10-CM | POA: Diagnosis not present

## 2021-11-18 DIAGNOSIS — M79671 Pain in right foot: Secondary | ICD-10-CM

## 2021-11-18 NOTE — Progress Notes (Signed)
Subjective: ?Stacey Mcdaniel is a 56 y.o. female patient who presents to office for evaluation of Right> Left foot pain secondary to callus skin.  Patient reports that is been a very long time but previous trims seem to help states that she is doing more standing now she works at [employer].  No other pedal complaints noted. ? ?Patient Active Problem List  ? Diagnosis Date Noted  ? 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 Nondisplaced fracture of fifth left metatarsal bone with routine healing 09/25/2017  ? ? ?Current Outpatient Medications on File Prior to Visit  ?Medication Sig Dispense Refill  ? conjugated estrogens (PREMARIN) vaginal cream     ? fluticasone (FLONASE) 50 MCG/ACT nasal spray Place into the nose.    ? guaiFENesin (MUCINEX) 600 MG 12 hr tablet Take by mouth.    ? Semaglutide,0.25 or 0.5MG/DOS, (OZEMPIC, 0.25 OR 0.5 MG/DOSE,) 2 MG/1.5ML SOPN     ? acetaminophen (TYLENOL) 325 MG tablet Take 650 mg by mouth every 6 (six) hours as needed for mild pain or headache.    ? buPROPion (WELLBUTRIN XL) 150 MG 24 hr tablet Take 450 mg by mouth daily.     ? dexmethylphenidate (FOCALIN XR) 20 MG 24 hr capsule Take 20 mg by mouth daily.     ? FLOWFLEX COVID-19 AG HOME TEST KIT     ? KLS OMPERAZOLE 20 MG TBEC Take 1 tablet by mouth daily.    ? levothyroxine (SYNTHROID, LEVOTHROID) 125 MCG tablet Take 125 mcg by mouth daily before breakfast.     ? nitrofurantoin, macrocrystal-monohydrate, (MACROBID) 100 MG capsule Take 100 mg by mouth 2 (two) times daily.    ? oseltamivir (TAMIFLU) 75 MG capsule Take 75 mg by mouth 2 (two) times daily.    ? ?Current Facility-Administered Medications on File Prior to Visit  ?Medication Dose Route Frequency Provider Last Rate Last Admin  ? triamcinolone acetonide (KENALOG) 10 MG/ML injection 10 mg  10 mg Other Once Landis Martins, DPM      ? ? ?No Known Allergies ? ?Objective:  ?General: Alert and oriented x3 in no acute distress ? ?Dermatology: Keratotic lesion present sub met 5 on  left and right 2nd toe with skin lines transversing the lesion, pain is present with direct pressure to the lesion with a central nucleated core noted, no webspace macerations, no ecchymosis bilateral, Old scar left foot,  all nails x 10 are well manicured. ? ?Vascular: Dorsalis Pedis and Posterior Tibial pedal pulses 2/4, Capillary Fill Time 3 seconds, + pedal hair growth bilateral, minimal edema bilateral lower extremities, Temperature gradient within normal limits. ? ?Neurology: Gross sensation intact via light touch bilateral. ? ?Musculoskeletal: Mild tenderness with palpation at the keratotic lesion site on Right>left, range of motion and strength within normal limits bilateral. ? ?Assessment and Plan: ?Problem List Items Addressed This Visit   ?None ?Visit Diagnoses   ? ? Porokeratosis    -  Primary  ? PVD (peripheral vascular disease) ([place])      ? Right foot pain      ? Left foot pain      ? ?  ? ? ?-Complete examination performed ?-Re-Discussed treatment options for keratotic lesions bilateral ?-Parred keratoic lesions x 3  using a chisel blade; treated the area withSalinocaine covered with bandaid ?-Continue with daily skin emollients and good supportive shoes daily for foot type and advised patient in the future may benefit from orthotics ?-Continue with compression garments especially at work for edema control  especially since she does a lot of standing ?-Patient to return to office as needed or sooner if condition worsens. ? ?Landis Martins, DPM ?

## 2022-01-30 ENCOUNTER — Ambulatory Visit: Payer: 59 | Admitting: Family Medicine

## 2022-02-01 NOTE — Progress Notes (Signed)
I, Christoper Fabian, LAT, ATC, am serving as scribe for Dr. Clementeen Graham.  Stacey Mcdaniel is a 56 y.o. female who presents to [facility] at [facility] today for f/u of L knee pain thought to be due to exacerbation of patellofemoral DJD.  She was last seen by Dr. Denyse Amass on 09/08/21 and had a L knee steroid injection and was encourage to con't working on her quad strengthening exercises.  Today, pt reports B knee pain, L>R, and L SIJ/hip pain.  She reports L knee swelling.  She has been wearing a brace on her L knee.  Her L hip/SIJ pain has been bothering her for 2-3 months that is progressively worsening.  She states that she is having a hard time laying on her L side when trying to sleep.  Diagnostic testing: L knee XR- 10/15/20  Pertinent review of systems: No fevers or chills  Relevant historical information: History of a cecal volvulus.  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  She lives in [place].   Exam:  BP 112/78 (BP Location: Left Arm, Patient Position: Sitting, Cuff Size: Normal)   Pulse 86   Ht 5\' 5"  (1.651 m)   Wt 140 lb 12.8 oz (63.9 kg)   SpO2 98%   BMI 23.43 kg/m  General: Well Developed, well nourished, and in no acute distress.   MSK:  L-spine: Nontender midline. Tender palpation just lateral to the left SI joint. Normal lumbar motion. Lower extremity strength is intact. Normal hip strength.  Right knee: Normal.  Normal motion with crepitation tender palpation mildly at medial joint line.  Left knee: Mild effusion normal motion with crepitation.  Tender palpation mild medial joint line.    Lab and Radiology Results  Procedure: Real-time Ultrasound Guided Injection of left knee superior lateral patellar space Device: Philips Affiniti 50G Images permanently stored and available for review in PACS Verbal informed consent obtained.  Discussed risks and benefits of procedure. Warned about infection, bleeding, hyperglycemia damage to  structures among others. Patient expresses understanding and agreement Time-out conducted.   Noted no overlying erythema, induration, or other signs of local infection.   Skin prepped in a sterile fashion.   Local anesthesia: Topical Ethyl chloride.   With sterile technique and under real time ultrasound guidance: 40 mg of Kenalog and 2 mL of Marcaine injected into knee joint. Fluid seen entering the joint capsule.   Completed without difficulty   Pain immediately resolved suggesting accurate placement of the medication.   Advised to call if fevers/chills, erythema, induration, drainage, or persistent bleeding.   Images permanently stored and available for review in the ultrasound unit.  Impression: Technically successful ultrasound guided injection.    Procedure: Real-time Ultrasound Guided Injection of the right knee superior lateral patellar space Device: Philips Affiniti 50G Images permanently stored and available for review in PACS Verbal informed consent obtained.  Discussed risks and benefits of procedure. Warned about infection, bleeding, hyperglycemia damage to structures among others. Patient expresses understanding and agreement Time-out conducted.   Noted no overlying erythema, induration, or other signs of local infection.   Skin prepped in a sterile fashion.   Local anesthesia: Topical Ethyl chloride.   With sterile technique and under real time ultrasound guidance: 40 mg of Kenalog and 2 mL of Marcaine injected into knee joint. Fluid seen entering the joint capsule.   Completed without difficulty   Pain immediately resolved suggesting accurate placement of the medication.   Advised to  call if fevers/chills, erythema, induration, drainage, or persistent bleeding.   Images permanently stored and available for review in the ultrasound unit.  Impression: Technically successful ultrasound guided injection.    X-ray images left hip and right knee obtained today personally  and independently interpreted  Left hip: No acute fractures.  No significant hip DJD.  Right knee: Mild patellofemoral DJD.  No acute fractures.  Await formal radiology review  EXAM: LEFT KNEE 3 VIEWS   COMPARISON:  None.   FINDINGS: Frontal, lateral, and sunrise patellar images were obtained. No fracture, dislocation, or joint effusion. There is moderately severe narrowing of the patellofemoral joint. Other joint spaces appear unremarkable. There is spurring in all compartments.   IMPRESSION: Moderately severe narrowing of the patellofemoral joint. Spurring in all compartments. No fracture, dislocation, or joint effusion.     Electronically Signed   By: Bretta Bang III M.D.   On: 10/15/2020 08:52  I, Clementeen Graham, personally (independently) visualized and performed the interpretation of the images attached in this note.    Assessment and Plan: 56 y.o. female with  Bilateral knee pain left worse than right.  Knee pain thought to be due to DJD. Plan for steroid injection bilateral knees today.  Her left knee pain is escalating.  The interval of steroid injections is decreasing indicating that her knee is failing.  Ultimately may need gel shots in the future and likely in the future will need a knee replacement.  We discussed this a bit today.  Her left low back pain is harder to figure out.  She is point tender just lateral to the left SI joint which is a little atypical.  Pain could be origin of hip abductors or atypical presentation of SI joint related pain or from low back muscle dysfunction.  We will try physical therapy first and if not improved consider direct SI joint or direct injection of area of tenderness in clinic on recheck.  Recheck in 2 months or sooner if needed.   PDMP not reviewed this encounter. Orders Placed This Encounter  Procedures   [place] LIMITED JOINT SPACE STRUCTURES LOW LEFT(NO LINKED CHARGES)    Order Specific Question:   Reason for Exam  (SYMPTOM  OR DIAGNOSIS REQUIRED)    Answer:   L knee pain    Order Specific Question:   Preferred imaging location?    Answer:   [facility] HIP UNILAT W OR W/O PELVIS 2-3 VIEWS LEFT    Standing Status:   Future    Standing Expiration Date:   03/05/2022    Order Specific Question:   Reason for Exam (SYMPTOM  OR DIAGNOSIS REQUIRED)    Answer:   L hip pain    Order Specific Question:   Is patient pregnant?    Answer:   No    Order Specific Question:   Preferred imaging location?    Answer:   [facility]   DG Knee AP/LAT W/Sunrise Right    Standing Status:   Future    Standing Expiration Date:   03/05/2022    Order Specific Question:   Reason for Exam (SYMPTOM  OR DIAGNOSIS REQUIRED)    Answer:   r knee pain    Order Specific Question:   Is patient pregnant?    Answer:   No    Order Specific Question:   Preferred imaging location?    Answer:   [facility]   Ambulatory referral to Physical Therapy  Referral Priority:   Routine    Referral Type:   Physical Medicine    Referral Reason:   Specialty Services Required    Requested Specialty:   Physical Therapy    Number of Visits Requested:   1   No orders of the defined types were placed in this encounter.    Discussed warning signs or symptoms. Please see discharge instructions. Patient expresses understanding.   The above documentation has been reviewed and is accurate and complete Lynne Leader, M.D.

## 2022-02-02 ENCOUNTER — Ambulatory Visit (INDEPENDENT_AMBULATORY_CARE_PROVIDER_SITE_OTHER): Payer: 59

## 2022-02-02 ENCOUNTER — Ambulatory Visit: Payer: 59 | Admitting: Family Medicine

## 2022-02-02 ENCOUNTER — Encounter: Payer: Self-pay | Admitting: Family Medicine

## 2022-02-02 ENCOUNTER — Ambulatory Visit: Payer: Self-pay

## 2022-02-02 VITALS — BP 112/78 | HR 86 | Ht 65.0 in | Wt 140.8 lb

## 2022-02-02 DIAGNOSIS — M533 Sacrococcygeal disorders, not elsewhere classified: Secondary | ICD-10-CM

## 2022-02-02 DIAGNOSIS — M25561 Pain in right knee: Secondary | ICD-10-CM

## 2022-02-02 DIAGNOSIS — M25552 Pain in left hip: Secondary | ICD-10-CM | POA: Diagnosis not present

## 2022-02-02 DIAGNOSIS — M25562 Pain in left knee: Secondary | ICD-10-CM

## 2022-02-02 DIAGNOSIS — G8929 Other chronic pain: Secondary | ICD-10-CM

## 2022-02-02 NOTE — Patient Instructions (Addendum)
Good to see you today.  You had B knee injections.  Call or go to the ER if you develop a large red swollen joint with extreme pain or oozing puss.   I've referred you to [facility] in [place].  Their office will call you to schedule but please let us know if you haven't heard from them in one week regarding scheduling.  Please get an Xray today before you leave.  Follow-up: 8 weeks

## 2022-02-03 NOTE — Progress Notes (Signed)
Left hip x-ray looks normal to radiology.

## 2022-02-03 NOTE — Progress Notes (Signed)
Right knee x-ray shows some arthritis changes

## 2022-03-15 NOTE — Progress Notes (Unsigned)
   I, Christoper Fabian, LAT, ATC, am serving as scribe for Dr. Clementeen Graham.  Stacey Mcdaniel is a 56 y.o. female who presents to [facility] at [facility] today for f/u of L knee and L hip/SIJ pain.  She was last seen by Dr. Denyse Amass on 02/02/22 and had B knee steroid injections.  She was also referred to [facility] in [place].  Today, pt reports tenderness over the anterior-medal portion of the lower leg, w/ swelling. Around the area of the pes anserine. Pt reports the L hip/SIJ pain is the same.   Diagnostic testing: L hip and R knee XR- 02/02/22; L knee XR- 10/15/20  Pertinent review of systems: No fevers or chills  Relevant historical information: Cecal volvulus history.   Exam:  BP 118/80   Pulse 78   Ht 5\' 5"  (1.651 m)   Wt 138 lb 3.2 oz (62.7 kg)   SpO2 98%   BMI 23.00 kg/m  General: Well Developed, well nourished, and in no acute distress.   MSK: Left knee: Mild swelling at Pes anserine bursa area. Tender palpation medial knee and proximal anterior tibia. Normal knee motion.  Intact strength.   Lab and Radiology Results  Procedure: Real-time Ultrasound Guided Injection of left knee Pes anserine bursa Device: Philips Affiniti 50G Images permanently stored and available for review in PACS Sound evaluation prior to injection confirms mild Pes anserine bursitis.  Medial joint line does show degenerative changes. Verbal informed consent obtained.  Discussed risks and benefits of procedure. Warned about infection, bleeding, hyperglycemia damage to structures among others. Patient expresses understanding and agreement Time-out conducted.   Noted no overlying erythema, induration, or other signs of local infection.   Skin prepped in a sterile fashion.   Local anesthesia: Topical Ethyl chloride.   With sterile technique and under real time ultrasound guidance: 40 Milligrams of Kenalog and 2 mL of Marcaine injected into pes anserine bursa. Fluid seen entering the  bursa.   Completed without difficulty   Pain immediately resolved suggesting accurate placement of the medication.   Advised to call if fevers/chills, erythema, induration, drainage, or persistent bleeding.   Images permanently stored and available for review in the ultrasound unit.  Impression: Technically successful ultrasound guided injection.        Assessment and Plan: 56 y.o. female with left anterior medial knee pain due to Pes anserine bursitis.  Plan for injection today.  Recommend Voltaren gel and continued home exercise program recheck back as needed.  Left low back/lateral hip pain.  Chronic issue.  This is much more controlled now.  Watchful waiting.  Recheck back as needed.   PDMP not reviewed this encounter. Orders Placed This Encounter  Procedures   53 LIMITED JOINT SPACE STRUCTURES LOW LEFT(NO LINKED CHARGES)    Order Specific Question:   Reason for Exam (SYMPTOM  OR DIAGNOSIS REQUIRED)    Answer:   left knee pain    Order Specific Question:   Preferred imaging location?    Answer:   [facility]   No orders of the defined types were placed in this encounter.    Discussed warning signs or symptoms. Please see discharge instructions. Patient expresses understanding.   The above documentation has been reviewed and is accurate and complete US, M.D.

## 2022-03-16 ENCOUNTER — Ambulatory Visit: Payer: Self-pay

## 2022-03-16 ENCOUNTER — Encounter: Payer: Self-pay | Admitting: Family Medicine

## 2022-03-16 ENCOUNTER — Ambulatory Visit: Payer: 59 | Admitting: Family Medicine

## 2022-03-16 ENCOUNTER — Telehealth: Payer: Self-pay

## 2022-03-16 VITALS — BP 118/80 | HR 78 | Ht 65.0 in | Wt 138.2 lb

## 2022-03-16 DIAGNOSIS — G8929 Other chronic pain: Secondary | ICD-10-CM | POA: Diagnosis not present

## 2022-03-16 DIAGNOSIS — M7050 Other bursitis of knee, unspecified knee: Secondary | ICD-10-CM

## 2022-03-16 DIAGNOSIS — M25562 Pain in left knee: Secondary | ICD-10-CM

## 2022-03-16 NOTE — Patient Instructions (Addendum)
Thank you for coming in today.   You received an injection today. Seek immediate medical attention if the joint becomes red, extremely painful, or is oozing fluid.   Recheck as needed.    

## 2022-03-16 NOTE — Telephone Encounter (Signed)
Pt called the office c/o increased pain around the area of injection and radiating down her leg. Pt advised this was likely a steroid flare and to watch it over the next 24 hours. If pain not improved, please contact us tomorrow morning. Advised if she starts feeling worse, or signs of infection to get seen immediately at an UC or ED. Pt verbalized understanding.

## 2022-03-30 ENCOUNTER — Ambulatory Visit: Payer: 59 | Admitting: Family Medicine

## 2022-12-21 IMAGING — US US THYROID
1 series · 14 of 25 positions shown · non-contrast
Comparison: 07/24/2019

CLINICAL DATA: Goiter.

EXAM:
THYROID ULTRASOUND
TECHNIQUE: Ultrasound examination of the thyroid gland and adjacent soft
tissues was performed.

[Series 1: us thyroid · 0.04mm/px · 14 of 34 slices shown]
[im 1/34]
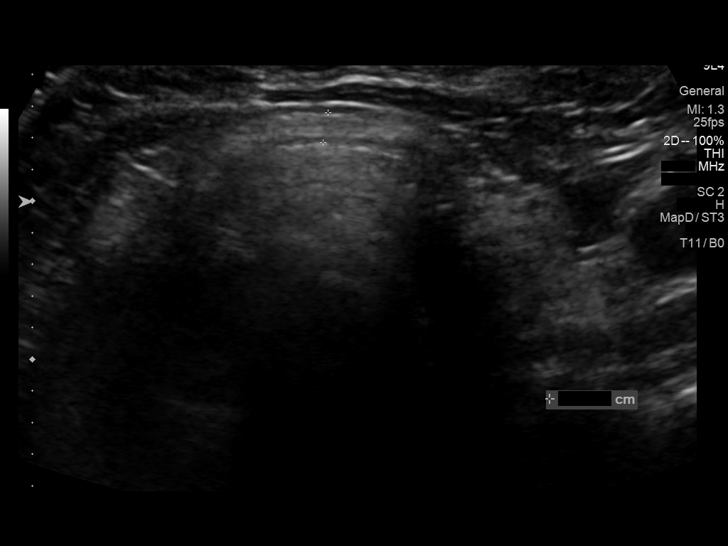
[im 3/34]
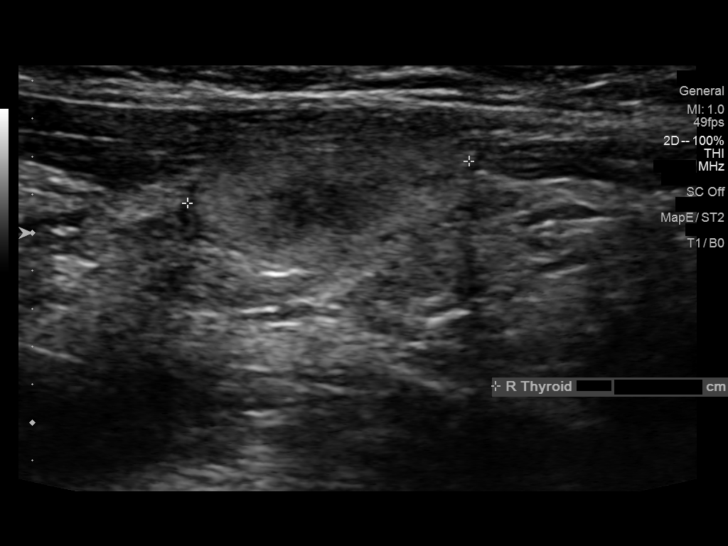
[im 6/34]
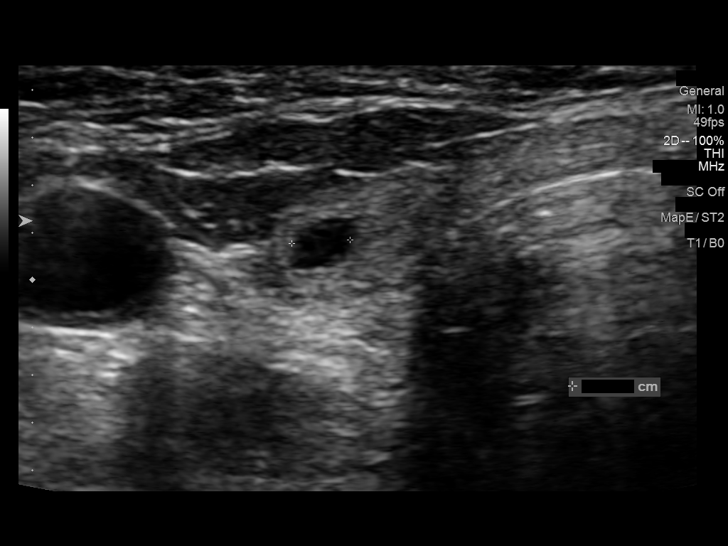
[im 9/34]
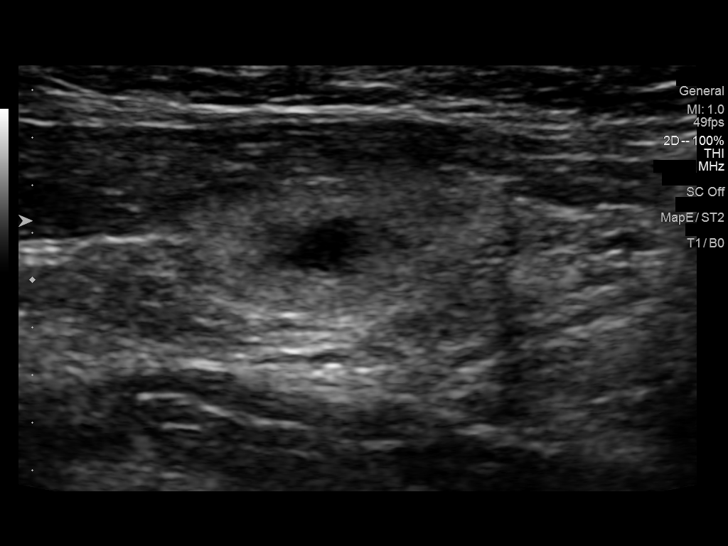
[im 12/34]
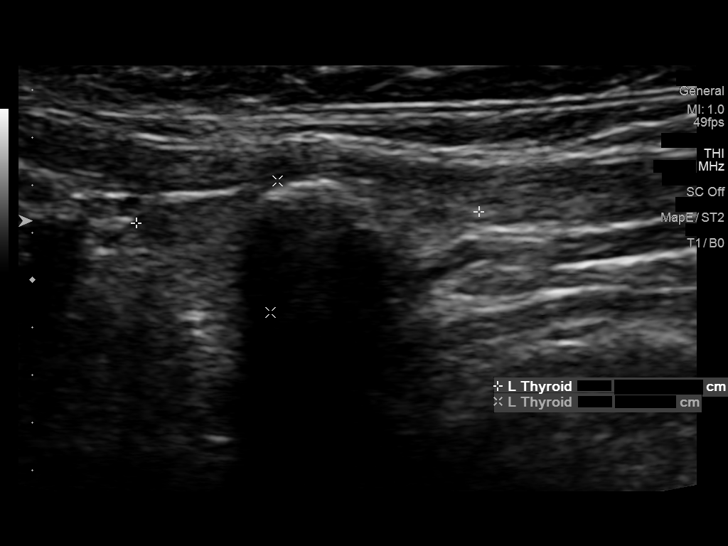
[im 13/34]
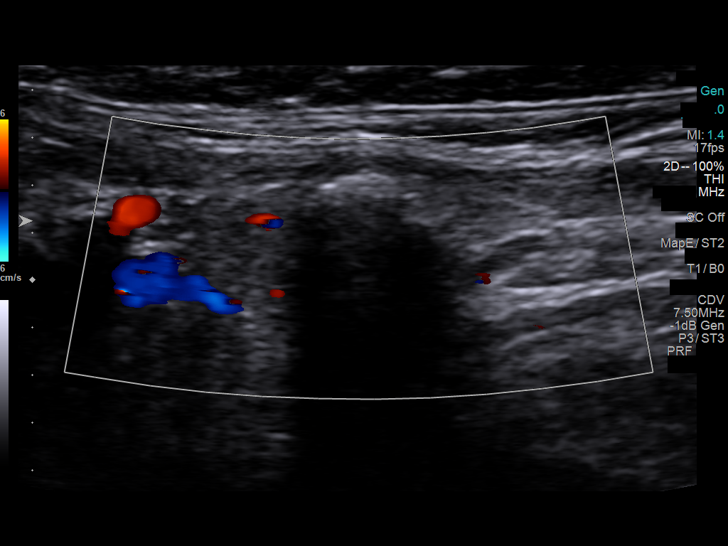
[im 16/34]
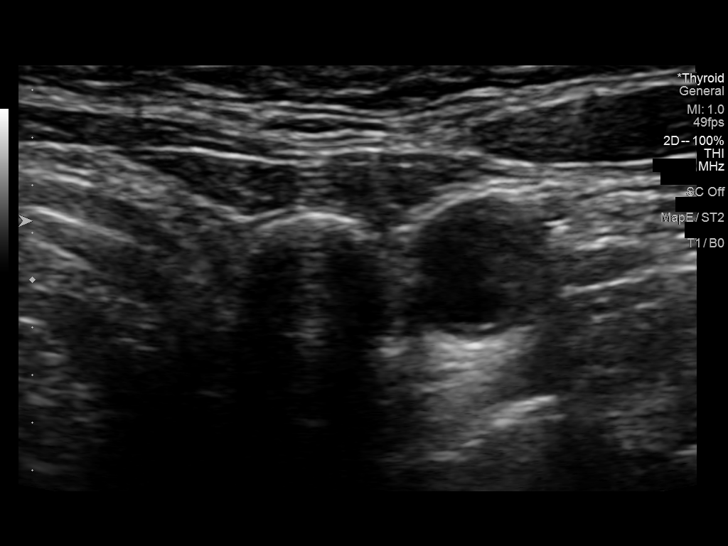
[im 18/34]
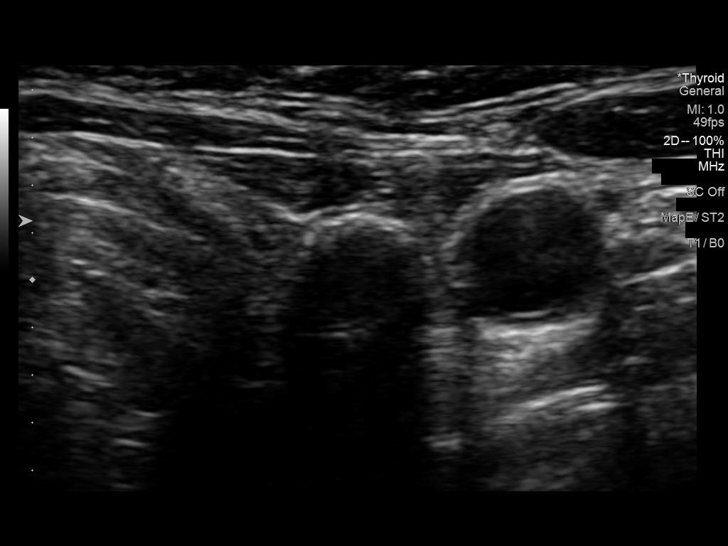
[im 21/34]
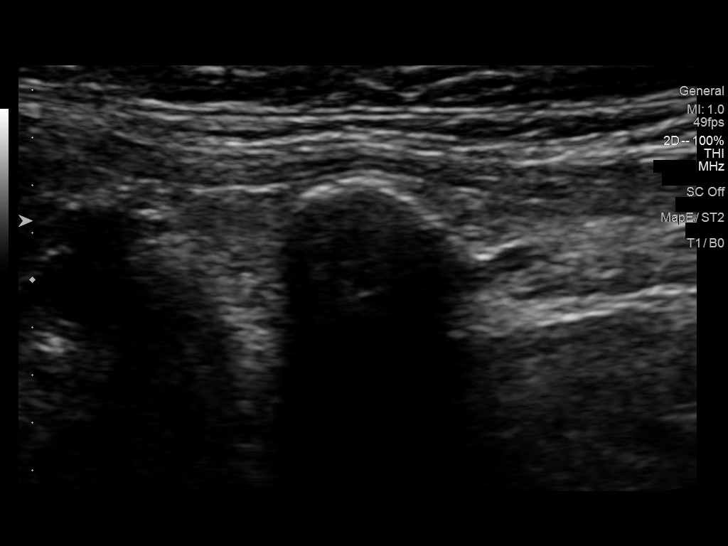
[im 23/34]
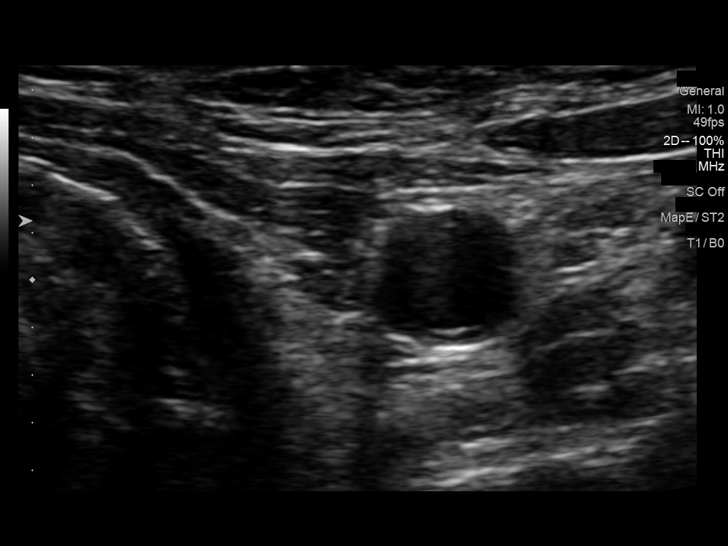
[im 25/34]
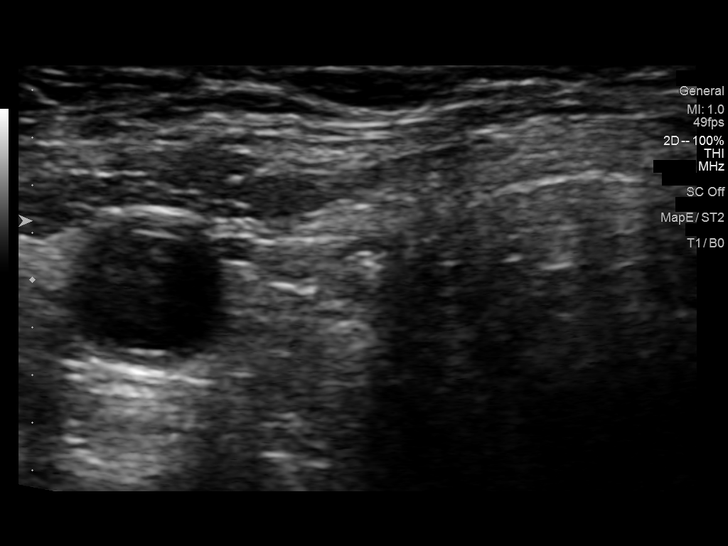
[im 28/34]
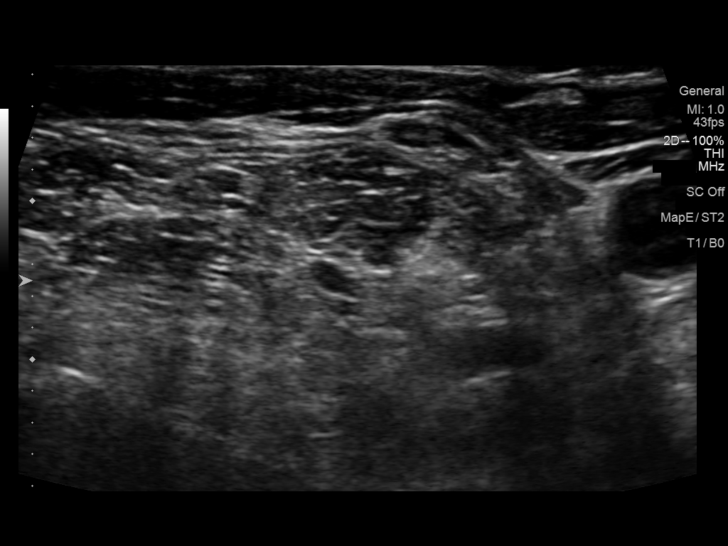
[im 31/34]
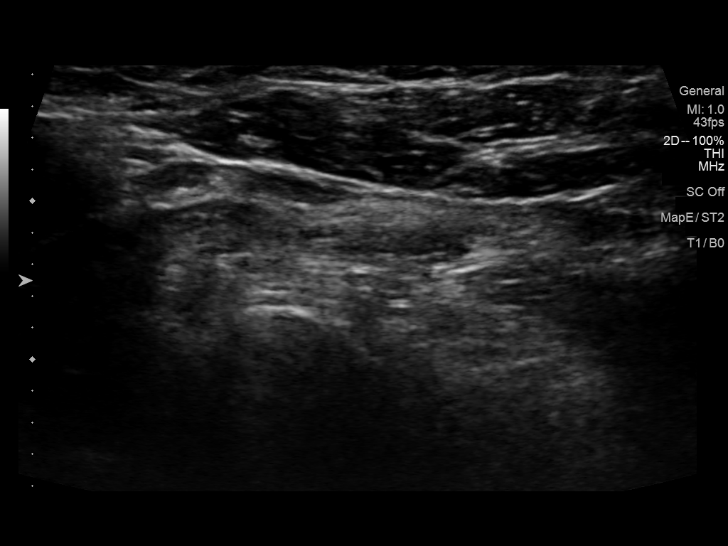
[im 34/34]
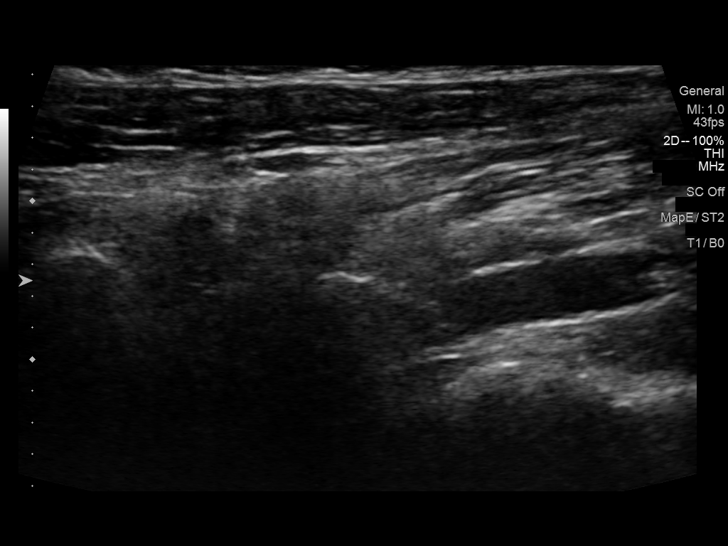

[14 of 25 positions shown; findings below may reference images not displayed]

FINDINGS: Parenchymal Echotexture: Markedly heterogenous

Isthmus: 0.2 cm, previously 0.2 cm

Right lobe: 1.5 x 0.7 x 0.6 cm, previously 2.0 x 0.7 x 0.8 cm

Left lobe: 1.4 x 0.6 x 0.7 cm, previously 1.9 x 0.5 x 0.5 cm

_________________________________________________________

Estimated total number of nodules >/= 1 cm: 0

Number of spongiform nodules >/=  2 cm not described below (TR1): 0

Number of mixed cystic and solid nodules >/= 1.5 cm not described
below (TR2): 0

_________________________________________________________

No new discrete nodules are seen within the thyroid gland. Similar
appearing tiny cystic nodule in the right thyroid lobe and calcified
nodule in the left thyroid lobe.
IMPRESSION: Similar appearing atrophic thyroid. No new or worrisome thyroid
nodules.

The above is in keeping with the ACR TI-RADS recommendations - [HOSPITAL] 3029;[DATE].

## 2023-01-31 ENCOUNTER — Ambulatory Visit: Payer: 59 | Admitting: Podiatry

## 2023-01-31 DIAGNOSIS — L84 Corns and callosities: Secondary | ICD-10-CM

## 2023-01-31 DIAGNOSIS — M25572 Pain in left ankle and joints of left foot: Secondary | ICD-10-CM | POA: Diagnosis not present

## 2023-01-31 DIAGNOSIS — M79672 Pain in left foot: Secondary | ICD-10-CM | POA: Diagnosis not present

## 2023-01-31 DIAGNOSIS — Q828 Other specified congenital malformations of skin: Secondary | ICD-10-CM | POA: Diagnosis not present

## 2023-01-31 DIAGNOSIS — M19072 Primary osteoarthritis, left ankle and foot: Secondary | ICD-10-CM

## 2023-01-31 DIAGNOSIS — M79671 Pain in right foot: Secondary | ICD-10-CM

## 2023-01-31 MED ORDER — DICLOFENAC SODIUM 1 % EX GEL: 2.0000 g | Freq: Four times a day (QID) | CUTANEOUS | 2 refills | Status: DC

## 2023-01-31 MED ORDER — TRIAMCINOLONE ACETONIDE 10 MG/ML IJ SUSP
10.0000 mg | Freq: Once | INTRAMUSCULAR | Status: AC
Start: 2023-01-31 — End: 2023-01-31
  Administered 2023-01-31: 10 mg

## 2023-01-31 NOTE — Progress Notes (Signed)
Subjective:  Patient ID: Stacey Mcdaniel, female    DOB: Jan 08, 1966,  MRN: [id number]  Stacey Mcdaniel presents to clinic today for:  Chief Complaint  Patient presents with   Callouses    Callus bilateral feet near the 5th toe on the ball of the foot. Patient is not diabetic.    Foot Pain    Burning to the top of left foot. Patient stated it started about 2 weeks ago. Burning sensation is constant. No known injuries.   . Patient denies trauma to the left foot.  She notes that she works at [employer] and stands on her feet all day.  She does note that she wears good sneakers when working.  States that this has been an ongoing condition that is slowly getting worse.  It feels better when she is off her feet but worse when standing and prolonged walking.  PCP is Buckner Malta, MD.  No Known Allergies  Review of Systems: Negative except as noted in the HPI.  Objective:  There were no vitals filed for this visit.  Stacey Mcdaniel is a pleasant 57 y.o. female in NAD. AAO x 3.  Vascular Examination: Capillary refill time is 3-5 seconds to toes bilateral. Palpable pedal pulses b/l LE. Digital hair present b/l. No pedal edema b/l. Skin temperature gradient WNL b/l. No varicosities b/l. No cyanosis or clubbing noted b/l.   Dermatological Examination: Pedal skin with normal turgor, texture and tone b/l. No open wounds. No interdigital macerations b/l.   Hyperkeratotic lesion present, with pain on palpation, located at 5 bilateral, submet 3 left foot, and plantar medial aspect of the right first MPJ.  Neurological Examination: Protective sensation intact with Semmes-Weinstein 10 gram monofilament b/l LE. Vibratory sensation intact b/l LE.  Musculoskeletal Examination: Muscle strength 5/5 to all LE muscle groups b/l.   Assessment/Plan: 1. Corns   2. Pain in joint of left foot   3. Porokeratosis   4. Right foot pain   5. Left foot pain   6. Primary osteoarthritis of left foot      Meds ordered this encounter  Medications   diclofenac Sodium (VOLTAREN) 1 % GEL    Sig: Apply 2 g topically 4 (four) times daily. Rub into affected area of foot 2 to 4 times daily    Dispense:  100 g    Refill:  2    The hyperkeratotic lesions were sharply debrided/shaved with sterile #312 blade.  Discussed with the patient what is causing the corns/calluses and reviewed treatment options today, including shaving the the painful lesion(s), off-loading techniques and pads, custom orthotics / shoe modifications.  If conservative treatment fails to provide enough relief, or if the frequency of recurrence increases, then will recommend surgical intervention to help prevent the painful skin lesions from recurring.   Discussed the arthritic condition of the dorsal left midfoot with the patient today.  Was recommended that she use topical Voltaren gel to the area 2-4 times daily as needed for pain.  Prescription was sent to her pharmacy.  She noted that she must have the 100 g tube in order for it to be covered by her pharmacy.  This was sent in as requested.  With the patient's consent and after sterile skin prep a corticosteroid injection was administered to the dorsal aspect of the left midfoot at the second metatarsal-tarsal joint.  This consisted of a mixture of 1% lidocaine plain 0.5% Marcaine plain and Kenalog 10 for total  of 1.25 cc administered.  She tolerated this well and a Band-Aid was applied.  Return if symptoms worsen or fail to improve.   Clerance Lav, DPM, FACFAS [facility]     [address] [place], [place] [address]                Office [phone]  Fax [phone]

## 2023-02-06 NOTE — Progress Notes (Unsigned)
   Rubin Payor, PhD, LAT, ATC acting as a scribe for Clementeen Graham, MD.  Clancy Gourd is a 57 y.o. female who presents to [facility] today for back pain. Pt was previously seen by Dr. Denyse Amass on 03/16/22 for L knee pain.  Today, pt c/o back pain x ***. Pt locates pain to ***  Radiating pain: LE numbness/tingling: LE weakness: Aggravates: Treatments tried:  Dx imaging: 02/02/22 L hip XR  Pertinent review of systems: ***  Relevant historical information: ***   Exam:  There were no vitals taken for this visit. General: Well Developed, well nourished, and in no acute distress.   MSK: ***    Lab and Radiology Results No results found for this or any previous visit (from the past 72 hour(s)). No results found.     Assessment and Plan: 57 y.o. female with ***   PDMP not reviewed this encounter. No orders of the defined types were placed in this encounter.  No orders of the defined types were placed in this encounter.    Discussed warning signs or symptoms. Please see discharge instructions. Patient expresses understanding.   ***

## 2023-02-07 ENCOUNTER — Other Ambulatory Visit: Payer: Self-pay

## 2023-02-07 ENCOUNTER — Ambulatory Visit: Payer: 59 | Admitting: Family Medicine

## 2023-02-07 ENCOUNTER — Telehealth: Payer: Self-pay

## 2023-02-07 ENCOUNTER — Encounter: Payer: Self-pay | Admitting: Family Medicine

## 2023-02-07 ENCOUNTER — Ambulatory Visit (INDEPENDENT_AMBULATORY_CARE_PROVIDER_SITE_OTHER): Payer: 59

## 2023-02-07 VITALS — BP 160/98 | HR 86 | Ht 65.0 in | Wt 146.8 lb

## 2023-02-07 DIAGNOSIS — G8929 Other chronic pain: Secondary | ICD-10-CM | POA: Diagnosis not present

## 2023-02-07 DIAGNOSIS — M545 Low back pain, unspecified: Secondary | ICD-10-CM | POA: Diagnosis not present

## 2023-02-07 DIAGNOSIS — E278 Other specified disorders of adrenal gland: Secondary | ICD-10-CM

## 2023-02-07 DIAGNOSIS — M533 Sacrococcygeal disorders, not elsewhere classified: Secondary | ICD-10-CM | POA: Diagnosis not present

## 2023-02-07 LAB — BASIC METABOLIC PANEL
BUN: 13 mg/dL (ref 6–23)
CO2: 30 mEq/L (ref 19–32)
Calcium: 9.2 mg/dL (ref 8.4–10.5)
Chloride: 102 mEq/L (ref 96–112)
Creatinine, Ser: 0.68 mg/dL (ref 0.40–1.20)
GFR: 97.65 mL/min (ref >60.00)
Glucose, Bld: 92 mg/dL (ref 70–99)
Potassium: 4.3 mEq/L (ref 3.5–5.1)
Sodium: 138 mEq/L (ref 135–145)

## 2023-02-07 NOTE — Progress Notes (Signed)
Kidney function is normal/stable.

## 2023-02-07 NOTE — Telephone Encounter (Signed)
Dr. Denyse Amass is calling [facility] to clarify as the correct order was entered.

## 2023-02-07 NOTE — Addendum Note (Signed)
Addended by: Rodolph Bong on: 02/07/2023 10:57 AM   Modules accepted: Orders

## 2023-02-07 NOTE — Telephone Encounter (Signed)
[facility] called stating patients order needs to be hanged to a CT abdomen with and without contrast and it also needs to state wether it is adrenal protocol or dedicated adrenal protocol

## 2023-02-07 NOTE — Telephone Encounter (Signed)
Spoke with Radiology.  Will change to CT scan at the pelvis without contrast.  This should be adequate for assessing the adrenal glands and looking at the back.

## 2023-02-07 NOTE — Telephone Encounter (Signed)
Noted  

## 2023-02-07 NOTE — Patient Instructions (Addendum)
Thank you for coming in today.   Please get an Xray today before you leave   You should hear from [facility] scheduling within 1 week. If you do not hear please let me know.    I've referred you to Physical Therapy.  Let us know if you don't hear from them in one week.   Check back in 6 weeks

## 2023-02-13 ENCOUNTER — Other Ambulatory Visit: Payer: Self-pay | Admitting: Podiatry

## 2023-02-13 ENCOUNTER — Encounter: Payer: Self-pay | Admitting: Family Medicine

## 2023-02-13 ENCOUNTER — Encounter: Payer: Self-pay | Admitting: Podiatry

## 2023-02-13 DIAGNOSIS — M19072 Primary osteoarthritis, left ankle and foot: Secondary | ICD-10-CM

## 2023-02-13 DIAGNOSIS — M25572 Pain in left ankle and joints of left foot: Secondary | ICD-10-CM

## 2023-02-13 MED ORDER — MELOXICAM 7.5 MG PO TABS
7.5000 mg | ORAL_TABLET | Freq: Every day | ORAL | 0 refills | Status: DC
Start: 2023-02-13 — End: 2023-11-23

## 2023-02-13 NOTE — Progress Notes (Signed)
Patient called noting injeciton on dorsal left midfoot was beneficial for 2 days only.    Rx Meloxicam and Rx physical therapy.  She recently started P.T. for her back, so will have the therapist address the left foot arthralgia as well.

## 2023-02-13 NOTE — Progress Notes (Signed)
Lumbar spine x-ray shows diffuse arthritis changes in the low back.

## 2023-03-09 ENCOUNTER — Ambulatory Visit
Admission: RE | Admit: 2023-03-09 | Discharge: 2023-03-09 | Disposition: A | Discharge status: Home / Self Care | Payer: 59 | Source: Ambulatory Visit | Attending: Family Medicine | Admitting: Family Medicine

## 2023-03-09 DIAGNOSIS — G8929 Other chronic pain: Secondary | ICD-10-CM

## 2023-03-09 DIAGNOSIS — E278 Other specified disorders of adrenal gland: Secondary | ICD-10-CM

## 2023-03-09 DIAGNOSIS — M545 Low back pain, unspecified: Secondary | ICD-10-CM

## 2023-03-13 ENCOUNTER — Other Ambulatory Visit: Payer: Self-pay | Admitting: Family Medicine

## 2023-03-14 ENCOUNTER — Encounter: Payer: Self-pay | Admitting: Family Medicine

## 2023-03-14 NOTE — Progress Notes (Signed)
CT scan abdomen and pelvis shows stable bilateral adrenal adenomas.  This is great news.  No further follow-up is necessary.  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  And there are some small hiatal hernia that may benefit follow-up with gastroenterology.

## 2023-03-21 ENCOUNTER — Ambulatory Visit: Payer: 59 | Admitting: Family Medicine

## 2023-11-23 ENCOUNTER — Ambulatory Visit: Admitting: Podiatry

## 2023-11-23 DIAGNOSIS — L84 Corns and callosities: Secondary | ICD-10-CM

## 2023-11-23 DIAGNOSIS — M19072 Primary osteoarthritis, left ankle and foot: Secondary | ICD-10-CM | POA: Diagnosis not present

## 2023-11-23 NOTE — Progress Notes (Signed)
     Chief Complaint  Patient presents with   Callouses    Callous bilaterally on the fore foot. ABN signed. Last A1c was 8, 6 months ago. No Anti coag. She will make separate apt for foot pain.    HPI: 58 y.o. female presents today for painful calluses bilateral.  She notes that the calluses/corns right submet 5 are the most painful today.  She also notes that she still has dorsal left midfoot pain.  She was informed this will be rescheduled to x-ray and evaluate the left foot at a future visit.  [facility] went to [facility] in [place]  Past Medical History:  Diagnosis Date   Allergy    Thyroid disease     Past Surgical History:  Procedure Laterality Date   ABDOMINAL HYSTERECTOMY     CHOLECYSTECTOMY     GASTRIC BYPASS     LAPAROTOMY N/A 08/05/2020   Procedure: EXPLORATORY LAPAROTOMY POSSIBLE SMALL BOWEL RESECTION/POSSIBLE COLECTOMY;  Surgeon: Axel Filler, MD;  Location: [facility] OR;  Service: General;  Laterality: N/A;    No Known Allergies   Physical Exam: Palpable pedal pulses.  There are hyperkeratotic lesions bilateral submet 1, submet 3, and submet 5.  The right submet 5 area has 2 focal IPK lesions with pain on palpation.  No surrounding erythema or any ulceration is noted.  Assessment/Plan of Care: 1. Primary osteoarthritis of left foot   2. Corns     Discussed clinical findings with patient today.  Patient will be rescheduled to evaluate the left midfoot discomfort.  She was diagnosed with arthritis of the left midfoot at her last visit.  Dispensed power step inserts to the patient today.  Recommend custom molded orthotics so that we can offload the multiple plantar forefoot calluses and corns to decrease pressure and decrease callus formation as well as support the midfoot better.  Will place order for the orthotic consult with our pedorthist today.  She will be scheduled in the near future with our pedorthist  The hyperkeratotic lesions were shaved with sterile #[id number]  blade.  Salinocaine was applied to the area right submet 5 and a Band-Aid was applied.  She can remove this tonight.   Clerance Lav, DPM, FACFAS [facility]     [address] [place], [place] [address]                Office [phone]  Fax [phone]

## 2023-12-28 ENCOUNTER — Ambulatory Visit (INDEPENDENT_AMBULATORY_CARE_PROVIDER_SITE_OTHER)

## 2023-12-28 ENCOUNTER — Ambulatory Visit: Admitting: Podiatry

## 2023-12-28 DIAGNOSIS — M19072 Primary osteoarthritis, left ankle and foot: Secondary | ICD-10-CM

## 2023-12-28 DIAGNOSIS — M792 Neuralgia and neuritis, unspecified: Secondary | ICD-10-CM | POA: Diagnosis not present

## 2023-12-28 NOTE — Progress Notes (Signed)
      Chief Complaint  Patient presents with   Foot Pain    Left midfoot pain with swelling across the top of the foot, she does have hardware on the lateral side of the foot.  States she was diabetic, but not anymore, I do not have an A1c    HPI: 58 y.o. female presents today with pain in the dorsal aspect of the left midfoot.  She also notes generalized burning and tingling to the entire foot bilateral.  Denies injury.  States that this has been progressively worsening.  Past Medical History:  Diagnosis Date   Allergy    Thyroid disease     Past Surgical History:  Procedure Laterality Date   ABDOMINAL HYSTERECTOMY     CHOLECYSTECTOMY     GASTRIC BYPASS     LAPAROTOMY N/A 08/05/2020   Procedure: EXPLORATORY LAPAROTOMY POSSIBLE SMALL BOWEL RESECTION/POSSIBLE COLECTOMY;  Surgeon: Shela Derby, MD;  Location: [facility] OR;  Service: General;  Laterality: N/A;    No Known Allergies  Review of Systems  Musculoskeletal:  Positive for joint pain.  Neurological:  Positive for sensory change.      Physical Exam: Palpable pedal pulses.  There is pain on palpation of the dorsal aspect of the left second-intermediate cuneiform joint.  No ecchymosis or erythema is noted.  Negative Tinel's sign with percussion of the posterior tibial nerve bilateral.  Protective sensation intact with Semmes Weinstein monofilament.  Radiographic Exam left foot, 3 weightbearing views:  Decreased osseous mineralization to the left foot.  Decreased joint space to the first MPJ on the second metatarsal-intermediate cuneiform joint.  There is dorsal spurring along the navicular-cuneiform joints as well as the second metatarsal-intermediate cuneiform joint.  No fracture is seen.  Assessment/Plan of Care: 1. Primary osteoarthritis of left foot   2. Neuralgia and neuritis    Discussed clinical findings with patient today.  With the patient's consent a corticosteroid injection was administered to the left second  metatarsal-intermediate cuneiform joint.  This consisted of a mixture of 1% lidocaine plain, 0.5% Marcaine plain and Kenalog 10 for total of 1.2 cc administered.  She tolerated this fairly well.  A Band-Aid was applied.  Offered to start the patient with anti-inflammatory/nerve pain medication, but she stated that she would like to see the results of her EMG/NCV first.  Order was placed for this today for bilateral lower extremity.  Will need to rule out radiculopathy versus nerve entrapment.  Her lower extremity symptoms may be coming from the lumbosacral area since the nerve testing on the bilateral foot is within normal limits.   Joe Murders, DPM, FACFAS [facility]     [address] [place], [place] [address]                Office [phone]  Fax [phone]

## 2024-01-09 ENCOUNTER — Encounter: Payer: Self-pay | Admitting: Neurology

## 2024-01-30 ENCOUNTER — Other Ambulatory Visit: Payer: Self-pay

## 2024-01-30 DIAGNOSIS — R202 Paresthesia of skin: Secondary | ICD-10-CM

## 2024-02-13 IMAGING — DX DG KNEE AP/LAT W/ SUNRISE*R*
3 series · 3 of 3 positions shown · non-contrast
Comparison: None Available.

CLINICAL DATA: Right knee pain

EXAM:
RIGHT KNEE 3 VIEWS

[knee ap]
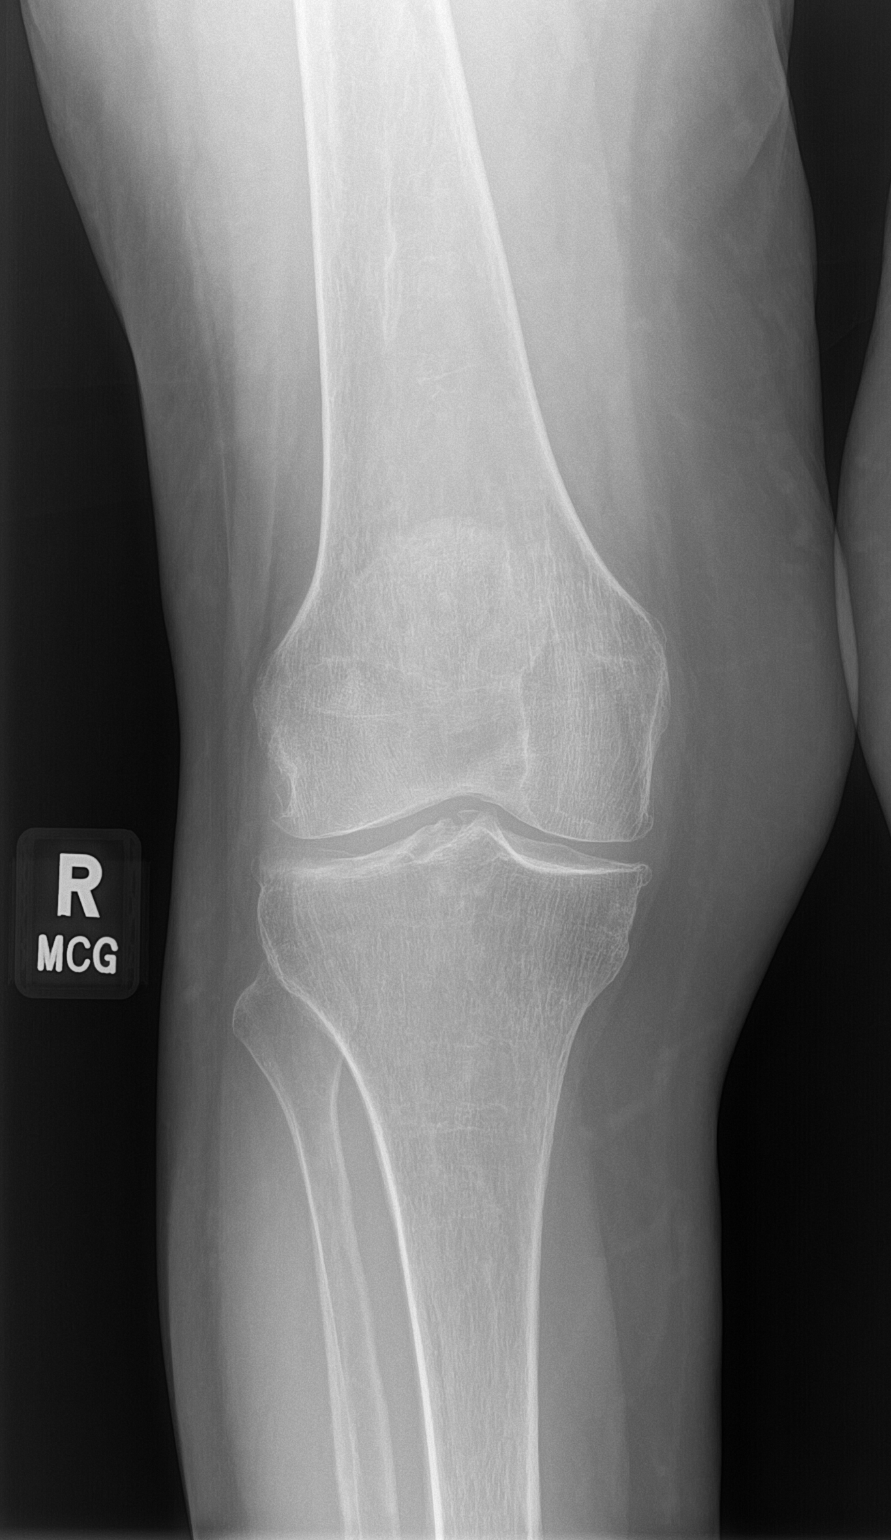

[knee lat]
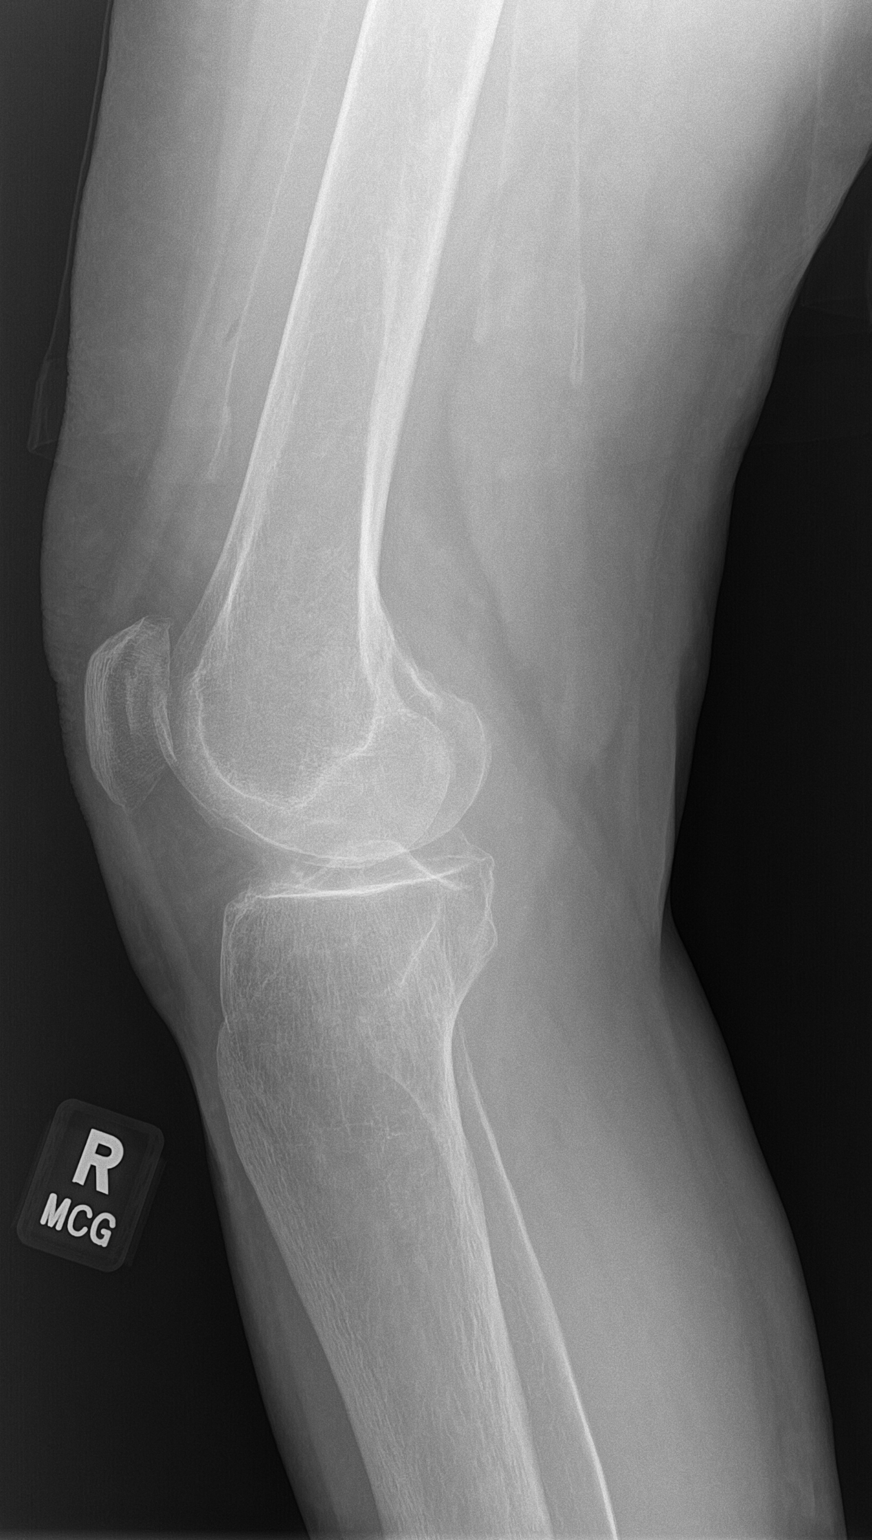

[patella]
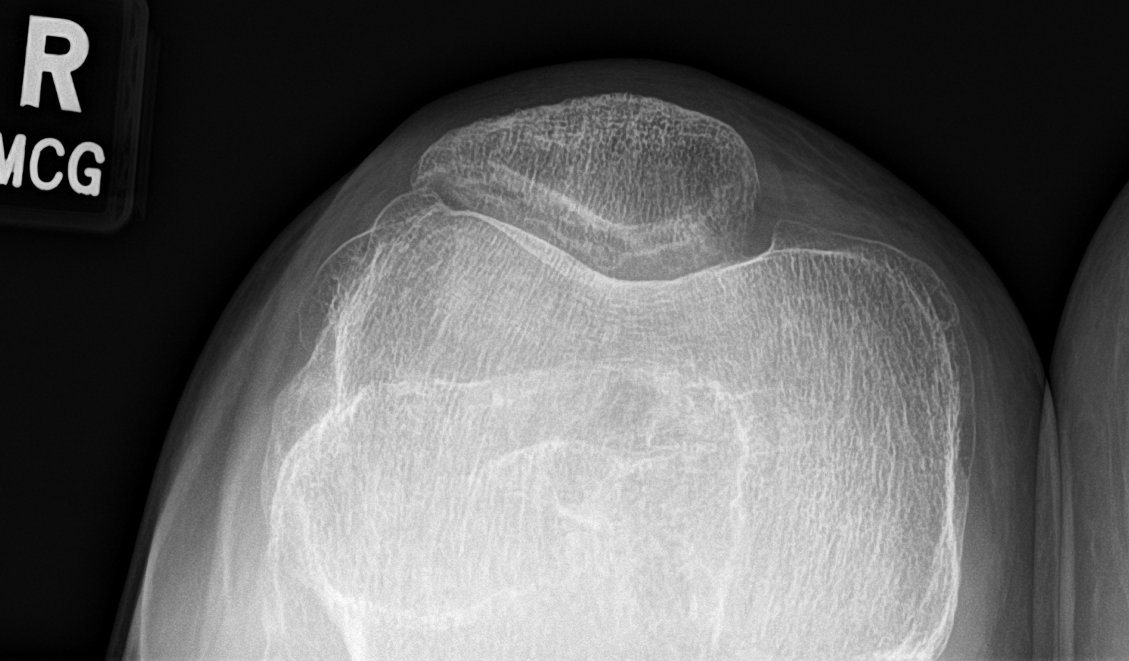

[3 of 3 positions shown; findings below may reference images not displayed]

FINDINGS: No acute fracture or dislocation identified. Bones appear
osteopenic. Mild tricompartmental joint space narrowing with small
marginal osteophytes. Joint effusion visualized.
IMPRESSION: Degenerative changes with no acute osseous abnormality identified.

## 2024-02-13 IMAGING — DX DG HIP (WITH OR WITHOUT PELVIS) 2-3V*L*
3 series · 3 of 3 positions shown · non-contrast
Comparison: None Available.

CLINICAL DATA: Left hip pain

EXAM:
DG HIP (WITH OR WITHOUT PELVIS) 2-3V LEFT

[pelvis ap]
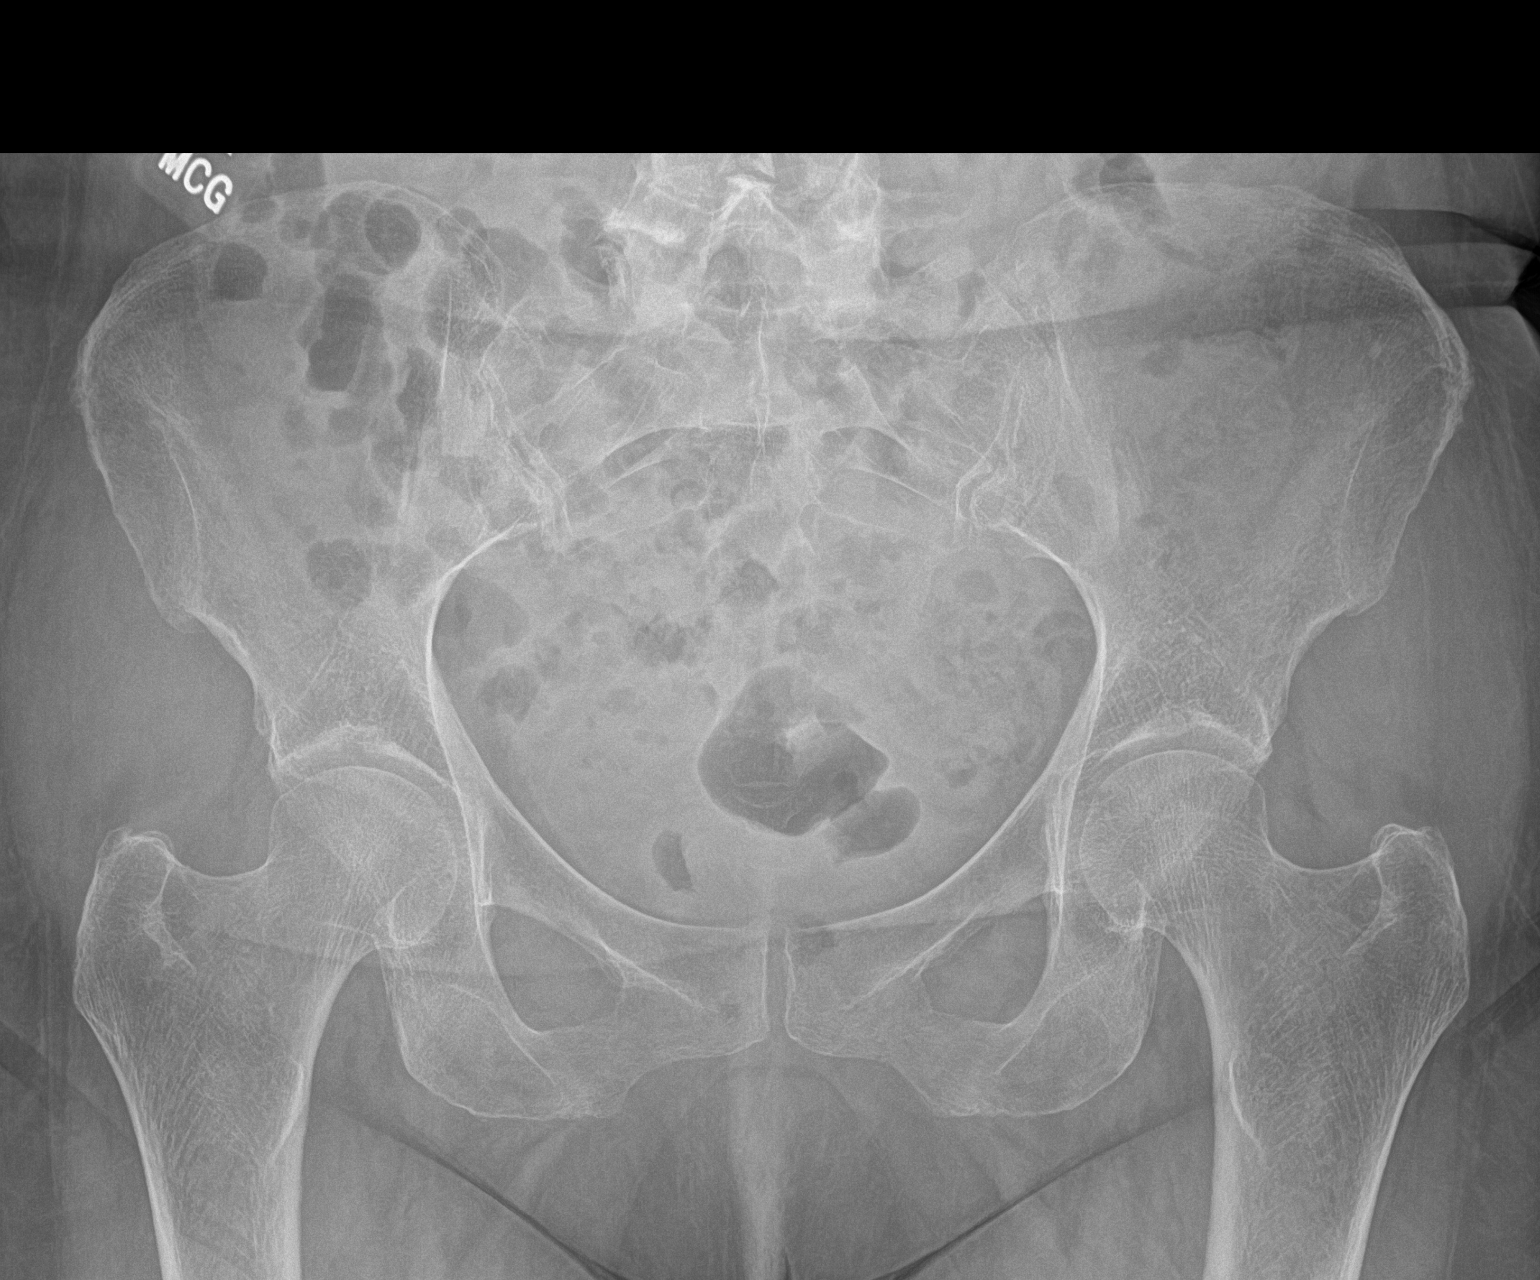

[hip ap]
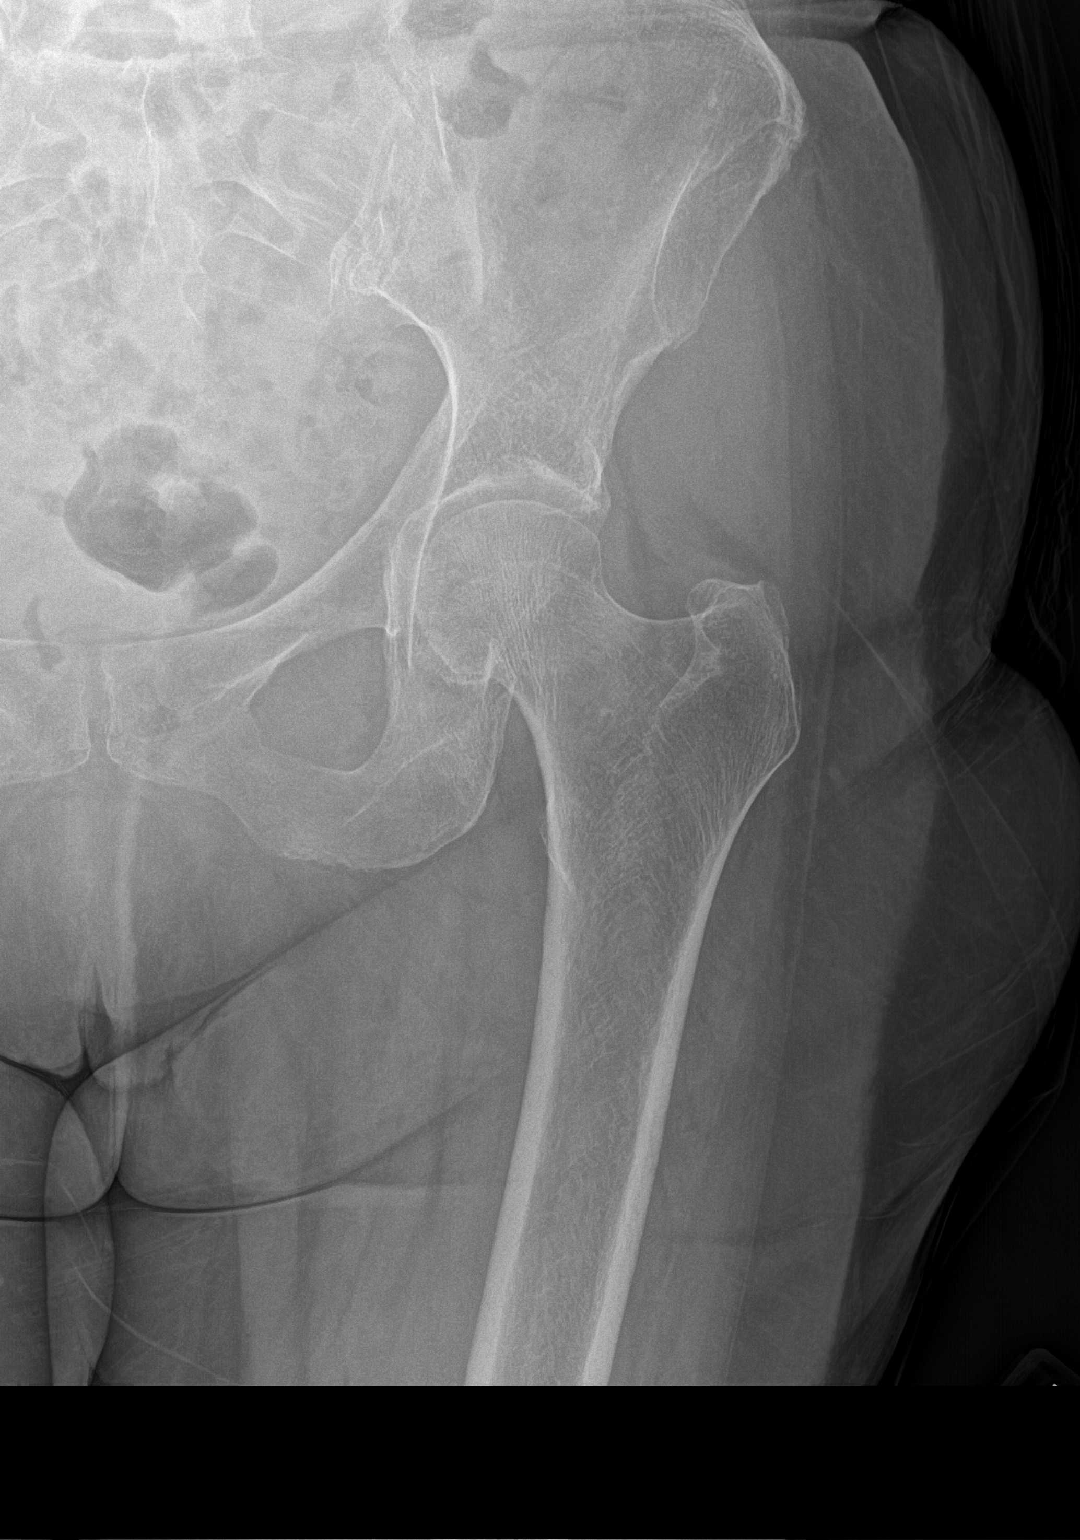

[hip frog leg]
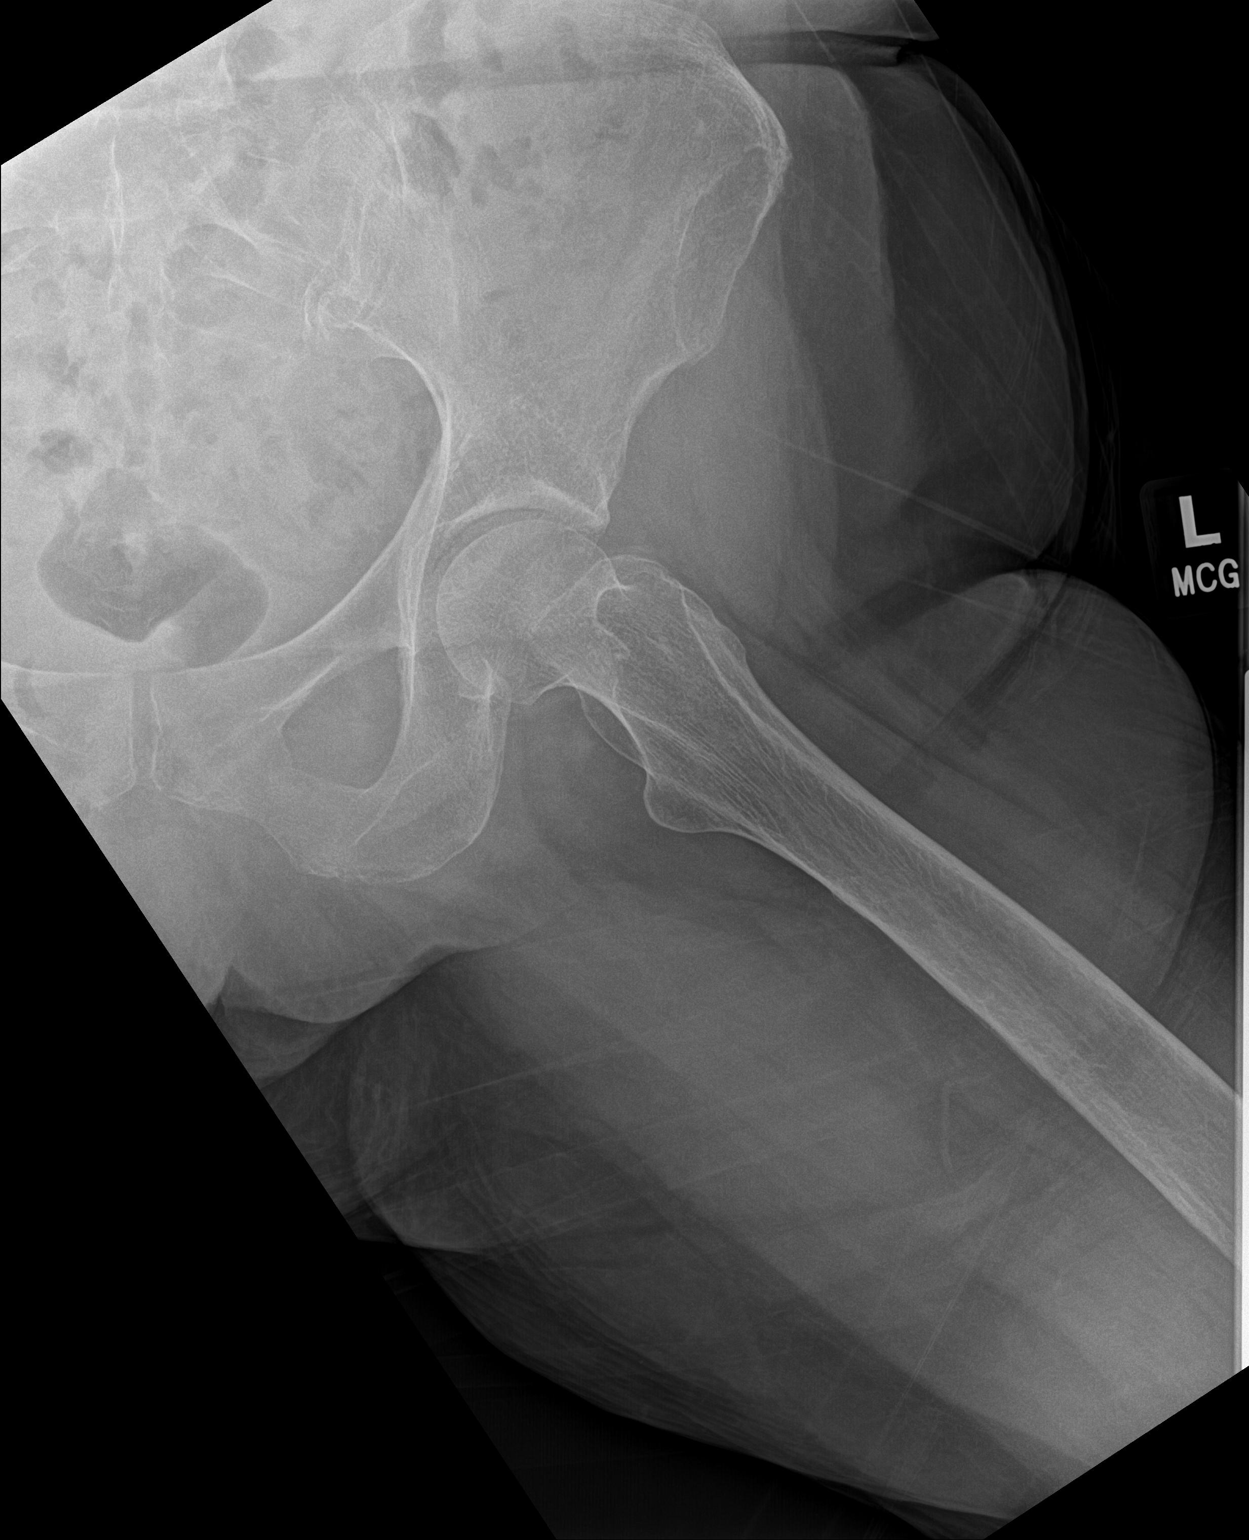

[3 of 3 positions shown; findings below may reference images not displayed]

FINDINGS: There is no evidence of hip fracture or dislocation. There is no
evidence of significant arthropathy or other focal bone abnormality.
Bones are osteopenic.
IMPRESSION: No acute fracture or dislocation identified.

## 2024-02-14 ENCOUNTER — Ambulatory Visit: Admitting: Neurology

## 2024-02-14 DIAGNOSIS — R202 Paresthesia of skin: Secondary | ICD-10-CM | POA: Diagnosis not present

## 2024-02-14 NOTE — Procedures (Signed)
 [facility]  [address] [place], [address]  [place], [place] [address] Tel: [phone] Fax: [phone] Test Date:  02/14/2024  Patient: Stacey Mcdaniel DOB: 1966-06-22 Physician: Reyna Cava, DO  Sex: Female Height: 5\' 5"  Ref Phys: Haze List, DPM  ID#: [id number]   Technician:    History: This is a 58 year old female referred for evaluation of bilateral lower extremity pain and paresthesias.  NCV & EMG Findings: Electrodiagnostic testing of the right lower extremity and additional studies of the left shows: Bilateral sural and superficial peroneal sensory responses are within normal limits. Bilateral peroneal and tibial motor responses are within normal limits. Bilateral tibial H reflex studies are within normal limits. There is no evidence of active or chronic motor axonal changes affecting any of the tested muscles.  Motor unit configuration and recruitment pattern is within normal limits.  Impression: This is a normal study of the lower extremities.  In particular, there is no evidence of a large fiber sensorimotor polyneuropathy or lumbosacral radiculopathy.   ___________________________ Reyna Cava, DO    Nerve Conduction Studies   Stim Site NR Peak (ms) Norm Peak (ms) O-P Amp (V) Norm O-P Amp  Left Sup Peroneal Anti Sensory (Ant Lat Mall)  32 C  12 cm    2.0 <4.6 6.9 >4  Right Sup Peroneal Anti Sensory (Ant Lat Mall)  32 C  12 cm    2.1 <4.6 7.0 >4  Left Sural Anti Sensory (Lat Mall)  32 C  Calf    2.6 <4.6 10.3 >4  Right Sural Anti Sensory (Lat Mall)  32 C  Calf    2.1 <4.6 9.5 >4     Stim Site NR Onset (ms) Norm Onset (ms) O-P Amp (mV) Norm O-P Amp Site1 Site2 Delta-0 (ms) Dist (cm) Vel (m/s) Norm Vel (m/s)  Left Peroneal Motor (Ext Dig Brev)  32 C  Ankle    3.1 <6.0 2.8 >2.5 B Fib Ankle 7.6 35.0 46 >40  B Fib    10.7  2.6  Poplt B Fib 1.6 8.0 50 >40  Poplt    12.3  2.5         Right Peroneal Motor (Ext Dig Brev)  32 C  Ankle    3.4  <6.0 2.6 >2.5 B Fib Ankle 7.5 36.0 48 >40  B Fib    10.9  2.1  Poplt B Fib 1.7 8.0 47 >40  Poplt    12.6  2.1         Left Tibial Motor (Abd Hall Brev)  32 C  Ankle    2.7 <6.0 9.4 >4 Knee Ankle 7.9 42.0 53 >40  Knee    10.6  8.3         Right Tibial Motor (Abd Hall Brev)  32 C  Ankle    4.5 <6.0 10.5 >4 Knee Ankle 6.6 40.0 61 >40  Knee    11.1  7.7          Electromyography   Side Muscle Ins.Act Fibs Fasc Recrt Amp Dur Poly Activation Comment  Right AntTibialis Nml Nml Nml Nml Nml Nml Nml Nml N/A  Right Gastroc Nml Nml Nml Nml Nml Nml Nml Nml N/A  Right Flex Dig Long Nml Nml Nml Nml Nml Nml Nml Nml N/A  Right RectFemoris Nml Nml Nml Nml Nml Nml Nml Nml N/A  Right GluteusMed Nml Nml Nml Nml Nml Nml Nml Nml N/A  Left AntTibialis Nml Nml Nml Nml Nml Nml Nml Nml N/A  Left Gastroc Nml Nml Nml Nml Nml Nml Nml Nml N/A  Left Flex Dig Long Nml Nml Nml Nml Nml Nml Nml Nml N/A  Left RectFemoris Nml Nml Nml Nml Nml Nml Nml Nml N/A  Left GluteusMed Nml Nml Nml Nml Nml Nml Nml Nml N/A      Waveforms:

## 2024-02-15 ENCOUNTER — Encounter: Payer: Self-pay | Admitting: Podiatry

## 2024-02-17 ENCOUNTER — Other Ambulatory Visit: Payer: Self-pay | Admitting: Podiatry

## 2024-02-17 MED ORDER — GABAPENTIN 100 MG PO CAPS
100.0000 mg | ORAL_CAPSULE | Freq: Three times a day (TID) | ORAL | 3 refills | Status: AC
Start: 2024-02-17 — End: ?

## 2024-02-17 NOTE — Progress Notes (Signed)
 Had message patient through MyChart to review her nerve conduction study findings.  Have recommended management with oral medication.  Patient responded at closing on Friday and then messaged back again 2 hours later stating that nothing had been sent in.  The messages had not been checked because the office was closed/closing at that time and I had already left the office for the weekend.  After reviewing her message today, prescription for gabapentin  100 mg 3 times daily was sent to her pharmacy.

## 2024-09-18 ENCOUNTER — Ambulatory Visit (HOSPITAL_BASED_OUTPATIENT_CLINIC_OR_DEPARTMENT_OTHER): Payer: Self-pay
